# Patient Record
Sex: Female | Born: 1970 | Race: White | Hispanic: No | Marital: Married | Smoking: Current every day smoker
Health system: Southern US, Community
[De-identification: ages and names within clinical notes are randomized; demographics above are authoritative.]

## PROBLEM LIST (undated history)

## (undated) DIAGNOSIS — N926 Irregular menstruation, unspecified: Secondary | ICD-10-CM

## (undated) DIAGNOSIS — R7989 Other specified abnormal findings of blood chemistry: Secondary | ICD-10-CM

## (undated) DIAGNOSIS — T8859XA Other complications of anesthesia, initial encounter: Secondary | ICD-10-CM

## (undated) DIAGNOSIS — R519 Headache, unspecified: Secondary | ICD-10-CM

## (undated) DIAGNOSIS — Z8669 Personal history of other diseases of the nervous system and sense organs: Secondary | ICD-10-CM

## (undated) DIAGNOSIS — R945 Abnormal results of liver function studies: Secondary | ICD-10-CM

## (undated) DIAGNOSIS — Z87442 Personal history of urinary calculi: Secondary | ICD-10-CM

## (undated) DIAGNOSIS — Z8489 Family history of other specified conditions: Secondary | ICD-10-CM

## (undated) DIAGNOSIS — F419 Anxiety disorder, unspecified: Secondary | ICD-10-CM

## (undated) DIAGNOSIS — T4145XA Adverse effect of unspecified anesthetic, initial encounter: Secondary | ICD-10-CM

## (undated) DIAGNOSIS — E348 Other specified endocrine disorders: Secondary | ICD-10-CM

## (undated) DIAGNOSIS — E785 Hyperlipidemia, unspecified: Secondary | ICD-10-CM

## (undated) DIAGNOSIS — R51 Headache: Secondary | ICD-10-CM

## (undated) DIAGNOSIS — E079 Disorder of thyroid, unspecified: Secondary | ICD-10-CM

## (undated) HISTORY — PX: WISDOM TOOTH EXTRACTION: SHX21

## (undated) HISTORY — DX: Other specified endocrine disorders: E34.8

## (undated) HISTORY — PX: TUBAL LIGATION: SHX77

## (undated) HISTORY — DX: Hyperlipidemia, unspecified: E78.5

## (undated) HISTORY — DX: Personal history of urinary calculi: Z87.442

## (undated) HISTORY — DX: Irregular menstruation, unspecified: N92.6

## (undated) HISTORY — DX: Personal history of other diseases of the nervous system and sense organs: Z86.69

## (undated) HISTORY — DX: Abnormal results of liver function studies: R94.5

## (undated) HISTORY — DX: Other specified abnormal findings of blood chemistry: R79.89

## (undated) HISTORY — DX: Disorder of thyroid, unspecified: E07.9

---

## 2000-07-31 ENCOUNTER — Encounter: Admission: RE | Admit: 2000-07-31 | Discharge: 2000-07-31 | Payer: Self-pay | Admitting: Family Medicine

## 2000-07-31 ENCOUNTER — Encounter: Payer: Self-pay | Admitting: Family Medicine

## 2001-10-23 ENCOUNTER — Encounter: Payer: Self-pay | Admitting: Family Medicine

## 2001-10-23 ENCOUNTER — Encounter: Admission: RE | Admit: 2001-10-23 | Discharge: 2001-10-23 | Payer: Self-pay | Admitting: Family Medicine

## 2004-12-20 ENCOUNTER — Encounter: Admission: RE | Admit: 2004-12-20 | Discharge: 2004-12-20 | Payer: Self-pay | Admitting: Family Medicine

## 2004-12-26 ENCOUNTER — Inpatient Hospital Stay (HOSPITAL_COMMUNITY): Admission: EM | Admit: 2004-12-26 | Discharge: 2004-12-30 | Payer: Self-pay | Admitting: Emergency Medicine

## 2005-11-02 ENCOUNTER — Encounter: Admission: RE | Admit: 2005-11-02 | Discharge: 2005-11-02 | Payer: Self-pay | Admitting: Family Medicine

## 2006-12-18 ENCOUNTER — Encounter: Admission: RE | Admit: 2006-12-18 | Discharge: 2006-12-18 | Payer: Self-pay | Admitting: Family Medicine

## 2006-12-18 LAB — HM MAMMOGRAPHY

## 2007-05-10 HISTORY — PX: KIDNEY STONE SURGERY: SHX686

## 2007-07-25 LAB — HM PAP SMEAR

## 2010-09-24 NOTE — Discharge Summary (Signed)
[name], [name]              ACCOUNT NO.:  [id number]   MEDICAL RECORD NO.:  [id number]          PATIENT TYPE:  INP   LOCATION:  A312                          FACILITY:  [facility]   PHYSICIAN:  Dennie Maizes, M.D.   DATE OF BIRTH:  28-Dec-1970   DATE OF ADMISSION:  12/26/2004  DATE OF DISCHARGE:  08/24/2006LH                                 DISCHARGE SUMMARY   FINAL DIAGNOSES:  1.  Left distal uretal calculus with obstruction.  2.  Left hydronephrosis.  3.  Left renal colic.   PROCEDURES ON December 29, 2004:  1.  Cystoscopy.  2.  Left retrograde pyelogram.  3.  Left ureteroscopy.  4.  Stone manipulation.  5.  Left aortal stent placement.   COMPLICATIONS:  None.   SUMMARY:  This 40 year old female was experiencing left flank pain radiating  to the front associated with nausea.  She went to the [facility] in  [place].  Noncontrast CT scan of the abdomen and pelvis revealed a 3x2 mm  size left mid ureteral calculus of the level of the L4 transverse process.  The patient was sent home with Percocet, as well as, Cipro.  The patient had  recurrent, severe pain on December 25, 2004.  She came to the emergency room  at [facility].  CT scan was repeated.  The stone migrated to the  left distal uretal about 2 cm above the uretal vesicle junction.  There was  increasing hydronephrosis, as well as, perinephric stranding.  The patient's  pain as quickly controlled in the emergency room.  She was admitted to the  hospital for IV fluids, pain control and further treatment.  There is no  past history of urolithiasis.  She denied having fever, chills, voiding  difficulty or gross hematuria.   PAST MEDICAL HISTORY:  No medical illness.   PAST SURGICAL HISTORY:  Status post tubal ligation.   MEDICATIONS:  1.  Cipro.  2.  Tylox.   ALLERGIES:  None.   PHYSICAL EXAMINATION:  GENERAL APPEARANCE:  The patient was comfortable  after receiving parenteral narcotics.  HEENT:   Normal.  NECK:  No masses.  LUNGS:  Clear to auscultation.  HEART:  Regular rate and rhythm, no murmurs.  ABDOMEN:  Soft, nonpalpable.  No flank mass.  Moderate costovertebral  tenderness was noted.  Bladder was not palpable.  No suprapubic tenderness.   ADMISSION LABORATORY DATA:  CBC:  WBC 14.9, hemoglobin 14.6, hematocrit  42.4, BUN 7, creatinine 0.9.  Electrolytes within normal range.  Urine  pregnancy test was negative.  Urinalysis revealed large blood nitrate,  negative leukocyte esterase.  Negative WBC 3-6 per high-powered field, RBC  21-55 per high-powered field.   HOSPITAL COURSE:  After admission to the hospital, the patient received IV  fluids and narcotics.  An x-ray of the KUB area was done.  This revealed a  3x2 mm sized left distal uretal calculus.  The patient continued to have  severe pain.  I discussed with the patient regarding management options.  She elected to undergo ureteroscopy stone extraction.   She was  taken to the OR on December 29, 2004.  Cystoscopy and left retrograde  pyelogram were done.  This revealed a filling defect in the distal ureter.  During ureteroscopy, the stone migrated to the upper ureter and could not be  retrieved.  A left uretal stent was placed.  The patient had good pain  relief after the stone manipulation and stent placement.  She was afebrile  with relief of pain on December 30, 2004.  She was discharged and sent home  Sep 29, 2004.  She was given Cipro and Tylox.  She will be reviewed in the  office on December 31, 2004, at which time the left aortal stent at the string  will be removed.  She was asked to call me for fever or severe flank pain.   CONDITION ON DISCHARGE:  Stable.      Dennie Maizes, M.D.  Electronically Signed     SK/MEDQ  D:  01/31/2005  T:  02/01/2005  Job:  [id number]

## 2010-09-24 NOTE — H&P (Signed)
[name], [name]              ACCOUNT NO.:  [id number]   MEDICAL RECORD NO.:  [id number]          PATIENT TYPE:  INP   LOCATION:  A312                          FACILITY:  [facility]   PHYSICIAN:  Dennie Maizes, M.D.   DATE OF BIRTH:  26-Oct-1970   DATE OF ADMISSION:  12/26/2004  DATE OF DISCHARGE:  LH                                HISTORY & PHYSICAL   CHIEF COMPLAINT:  Severe left flank pain radiating to the front, nausea.   HISTORY OF PRESENT ILLNESS:  This 40 year old female experiencing left flank  pain radiating to the front, associated with nausea.  She went to the [facility] in [place].  Noncontrast CT scan of abdomen and pelvis  revealed a 3 x 2 mm size left mid ureteral calculus at the level of L4  transverse process.  The patient was sent home with Cipro as well as  Percocet.  The patient had recurrent severe pain last night, and she came to  the emergency room at [facility].  A CT scan was repeated.  It  started to migrate to the left distal ureter about 2 cm above the  uterosacral junction.  There was increase in the hydronephrosis as well as  perinephric stranding.  The patient's pain was not adequately controlled in  the emergency room.  She has been admitted to the hospital for pain control,  IV fluids, and further treatment.  The patient does not have a history of  urolithiasis in the past.  She did not have any fever, chills, voiding  difficulty, or gross hematuria.   PAST MEDICAL HISTORY:  No medical illnesses.  Status post tubal ligation.   MEDICATIONS:  Cipro and Tylox.   ALLERGIES:  None.   PHYSICAL EXAMINATION:  GENERAL:  The patient is comfortable after receiving  parenteral narcotics.  HEAD, EYES, EARS, NOSE, AND THROAT:  Normal.  NECK:  No masses.  LUNGS:  Clear to auscultation.  HEART: Regular rate and rhythm.  No murmurs.  ABDOMEN:  Soft, no palpable flank mass.  Moderate left costovertebral angle  tenderness was noted.  Bladder  not palpable.  No suprapubic tenderness.   ADMISSION LABORATORY DATA:  CBC: WBC 14.9, hemoglobin 14.6, hematocrit 42.4.  BUN 7, creatinine 0.9.  Electrolytes within normal range.  Urine pregnancy  test is negative.  Urinalysis revealed large blood, nitrite negative,  leukocyte esterase negative, wbc's 3 to 6 per high power field, rbc's 21 to  50 per high power field.   IMPRESSION:  1.  Left distal ureter calculus with obstruction.  2.  Left renal colic.  3.  Left hydronephrosis.   PLAN:  1.  Admit the patient to the hospital.  2.  IV fluids.  3.  Strain all urine  4.  Parenteral narcotics.  5.  Discussed with the patient regarding management options.  Will repeat      KUB for stone migration.  If she has persistent severe pain, will      proceed with cystoscopy, left retrograde pyelogram, and ureteroscopy      stone extraction.  Dennie Maizes, M.D.  Electronically Signed     SK/MEDQ  D:  12/27/2004  T:  12/27/2004  Job:  [id number]

## 2010-09-24 NOTE — Op Note (Signed)
[name], [name]              ACCOUNT NO.:  [id number]   MEDICAL RECORD NO.:  [id number]          PATIENT TYPE:  INP   LOCATION:  A312                          FACILITY:  [facility]   PHYSICIAN:  Dennie Maizes, M.D.   DATE OF BIRTH:  Aug 24, 1970   DATE OF PROCEDURE:  12/29/2004  DATE OF DISCHARGE:                                 OPERATIVE REPORT   PREOPERATIVE DIAGNOSIS:  Left distal ureteral calculus with obstruction,  left renal colic, left hydronephrosis.   POSTOPERATIVE DIAGNOSIS:  Left distal ureteral calculus with obstruction,  left renal colic, left hydronephrosis.   OPERATIVE PROCEDURE:  Cystoscopy, left retrograde pyelogram,  left  ureteroscopy stone manipulation and left ureteral stent placement.   ANESTHESIA:  General.   SURGEON:  Dennie Maizes, M.D.   BLOOD LOSS:  None.   ESTIMATED BLOOD LOSS:  None.   DRAINS:  6 French 26 cm size left ureteral stent with a string.   INDICATIONS FOR PROCEDURE:  This 40 year old female was admitted to the  hospital with severe left flank pain. Evaluation revealed a 3 x 2 mm size  left distal ureteral calculus without obstruction and hydronephrosis.  The  patient was unable to pass the stone.  Preoperative KUB revealed a 3 x 2 mm  size left distal ureteral calculus. The patient was taken to the OR today  for cystoscopy, left retrograde pyelogram, ureteroscopy stone extraction and  stent placement.   DESCRIPTION OF PROCEDURE:  General anesthesia was induced and the patient  was placed on the OR table in the -dorsal lithotomy position. The lower  abdomen and genitalia were prepped and draped in a sterile fashion.  Cystoscopy was done with a 25-French scope. The appearance of bladder was  normal.  The trigone, ureteral orifices and bladder mucosa were  unremarkable.  The 5-French catheter was then placed in the left ureteral  orifice and retrograde pyelogram was done using about 7 cc of Renografin 60.  There was a small filling  defect in the distal ureter with change in caliber  of the ureter size.  From proximal to the filling defect, there was  hydroureter and hydronephrosis.  .   The 5-French open-ended catheter was then placed in the left ureteral  orifice.  A 0.038'' Benson guidewire was then inserted to the left  collecting system without any difficulty.  The distal ureter was then  dilated using a 15-French balloon dilating catheter.  The balloon dilating  catheter was then removed leaving the guidewire in place.   Ureteroscopy was done with 9-French rigid ureteroscope.  There was erythema  of the distal ureter on the site of impaction of the stone.  The stone could  not be seen.  I presume the stone had migrated to the upper collecting  system during the placement of the guidewire.  I looked into the proximal  ureter and there were no stones seen.  The ureteroscope was then removed.  The 6-French 36 cm size stent with a string was then inserted the left  collecting system.  The patient was transferred to the PACU in a  satisfactory condition.  I anticipate a small stone to pass after removal of the stent.      Dennie Maizes, M.D.  Electronically Signed     SK/MEDQ  D:  12/29/2004  T:  12/30/2004  Job:  [id number]

## 2013-08-09 ENCOUNTER — Encounter: Payer: Self-pay | Admitting: Family Medicine

## 2013-08-09 DIAGNOSIS — R7989 Other specified abnormal findings of blood chemistry: Secondary | ICD-10-CM | POA: Insufficient documentation

## 2013-08-09 DIAGNOSIS — E348 Other specified endocrine disorders: Secondary | ICD-10-CM | POA: Insufficient documentation

## 2013-08-09 DIAGNOSIS — Z87442 Personal history of urinary calculi: Secondary | ICD-10-CM | POA: Insufficient documentation

## 2013-08-09 DIAGNOSIS — E079 Disorder of thyroid, unspecified: Secondary | ICD-10-CM | POA: Insufficient documentation

## 2013-08-09 DIAGNOSIS — E785 Hyperlipidemia, unspecified: Secondary | ICD-10-CM | POA: Insufficient documentation

## 2013-08-09 DIAGNOSIS — N926 Irregular menstruation, unspecified: Secondary | ICD-10-CM | POA: Insufficient documentation

## 2013-08-09 DIAGNOSIS — Z8669 Personal history of other diseases of the nervous system and sense organs: Secondary | ICD-10-CM | POA: Insufficient documentation

## 2013-08-09 DIAGNOSIS — R945 Abnormal results of liver function studies: Secondary | ICD-10-CM

## 2013-08-14 ENCOUNTER — Encounter: Payer: Self-pay | Admitting: Physician Assistant

## 2013-08-14 ENCOUNTER — Ambulatory Visit (INDEPENDENT_AMBULATORY_CARE_PROVIDER_SITE_OTHER): Payer: BC Managed Care – PPO | Admitting: Physician Assistant

## 2013-08-14 VITALS — BP 96/64 | HR 96 | Temp 97.4°F | Resp 18 | Ht 63.25 in | Wt 146.0 lb

## 2013-08-14 DIAGNOSIS — E785 Hyperlipidemia, unspecified: Secondary | ICD-10-CM

## 2013-08-14 DIAGNOSIS — E079 Disorder of thyroid, unspecified: Secondary | ICD-10-CM

## 2013-08-14 DIAGNOSIS — E038 Other specified hypothyroidism: Secondary | ICD-10-CM | POA: Insufficient documentation

## 2013-08-14 DIAGNOSIS — Z23 Encounter for immunization: Secondary | ICD-10-CM

## 2013-08-14 DIAGNOSIS — E559 Vitamin D deficiency, unspecified: Secondary | ICD-10-CM | POA: Insufficient documentation

## 2013-08-14 DIAGNOSIS — N952 Postmenopausal atrophic vaginitis: Secondary | ICD-10-CM

## 2013-08-14 DIAGNOSIS — Z0000 Encounter for general adult medical examination without abnormal findings: Secondary | ICD-10-CM

## 2013-08-14 DIAGNOSIS — E039 Hypothyroidism, unspecified: Secondary | ICD-10-CM

## 2013-08-14 DIAGNOSIS — F17200 Nicotine dependence, unspecified, uncomplicated: Secondary | ICD-10-CM

## 2013-08-14 LAB — CBC WITH DIFFERENTIAL/PLATELET
Basophils Absolute: 0 10*3/uL (ref 0.0–0.1)
Basophils Relative: 0 % (ref 0–1)
Eosinophils Absolute: 0.2 10*3/uL (ref 0.0–0.7)
Eosinophils Relative: 2 % (ref 0–5)
HCT: 41.7 % (ref 36.0–46.0)
HEMOGLOBIN: 14.1 g/dL (ref 12.0–15.0)
LYMPHS PCT: 31 % (ref 12–46)
Lymphs Abs: 2.8 10*3/uL (ref 0.7–4.0)
MCH: 29.7 pg (ref 26.0–34.0)
MCHC: 33.8 g/dL (ref 30.0–36.0)
MCV: 88 fL (ref 78.0–100.0)
MONOS PCT: 8 % (ref 3–12)
Monocytes Absolute: 0.7 10*3/uL (ref 0.1–1.0)
NEUTROS ABS: 5.4 10*3/uL (ref 1.7–7.7)
Neutrophils Relative %: 59 % (ref 43–77)
PLATELETS: 319 10*3/uL (ref 150–400)
RBC: 4.74 MIL/uL (ref 3.87–5.11)
RDW: 13.6 % (ref 11.5–15.5)
WBC: 9.1 10*3/uL (ref 4.0–10.5)

## 2013-08-14 LAB — COMPLETE METABOLIC PANEL WITH GFR
ALBUMIN: 4.2 g/dL (ref 3.5–5.2)
ALK PHOS: 97 U/L (ref 39–117)
ALT: 15 U/L (ref 0–35)
AST: 17 U/L (ref 0–37)
BILIRUBIN TOTAL: 0.3 mg/dL (ref 0.2–1.2)
BUN: 9 mg/dL (ref 6–23)
CALCIUM: 9.5 mg/dL (ref 8.4–10.5)
CHLORIDE: 104 meq/L (ref 96–112)
CO2: 25 mEq/L (ref 19–32)
CREATININE: 0.64 mg/dL (ref 0.50–1.10)
GLUCOSE: 78 mg/dL (ref 70–99)
Potassium: 4.1 mEq/L (ref 3.5–5.3)
SODIUM: 140 meq/L (ref 135–145)
TOTAL PROTEIN: 6.5 g/dL (ref 6.0–8.3)

## 2013-08-14 LAB — LIPID PANEL
CHOLESTEROL: 183 mg/dL (ref 0–200)
HDL: 38 mg/dL — AB (ref >39)
LDL CALC: 116 mg/dL — AB (ref 0–99)
TRIGLYCERIDES: 145 mg/dL (ref <150)
Total CHOL/HDL Ratio: 4.8 Ratio
VLDL: 29 mg/dL (ref 0–40)

## 2013-08-14 LAB — T4, FREE: FREE T4: 1.11 ng/dL (ref 0.80–1.80)

## 2013-08-14 LAB — TSH: TSH: 3.5 u[IU]/mL (ref 0.350–4.500)

## 2013-08-14 MED ORDER — BUPROPION HCL ER (SR) 150 MG PO TB12
150.0000 mg | ORAL_TABLET | Freq: Two times a day (BID) | ORAL | Status: DC
Start: 2013-08-14 — End: 2014-03-06

## 2013-08-14 MED ORDER — ESTROGENS, CONJUGATED 0.625 MG/GM VA CREA: 1.0000 | TOPICAL_CREAM | Freq: Every day | VAGINAL | Status: DC

## 2013-08-14 NOTE — Progress Notes (Signed)
Patient ID: Debra LoboRebecca J Adams MRN: [id number], DOB: [birth year], 43 y.o. Date of Encounter: 08/14/2013,   Chief Complaint: Physical (CPE)  HPI: 43 y.o. y/o female  here for CPE.   SHe does report that she has noticed a lot of vaginal dryness and dyspareunia since being postmenopausal.  No other complaints.   Review of Systems: Consitutional: No fever, chills, fatigue, night sweats, lymphadenopathy. No significant/unexplained weight changes. Eyes: No visual changes, eye redness, or discharge. ENT/Mouth: No ear pain, sore throat, nasal drainage, or sinus pain. Cardiovascular: No chest pressure,heaviness, tightness or squeezing, even with exertion. No increased shortness of breath or dyspnea on exertion.No palpitations, edema, orthopnea, PND. Respiratory: No cough, hemoptysis, SOB, or wheezing. Gastrointestinal: No anorexia, dysphagia, reflux, pain, nausea, vomiting, hematemesis, diarrhea, constipation, BRBPR, or melena. Breast: No mass, nodules, bulging, or retraction. No skin changes or inflammation. No nipple discharge. No lymphadenopathy. Genitourinary: No dysuria, hematuria, incontinence, vaginal discharge, pruritis, burning, abnormal bleeding, or pain. Musculoskeletal: No decreased ROM, No joint pain or swelling. No significant pain in neck, back, or extremities. Skin: No rash, pruritis, or concerning lesions. Neurological: No headache, dizziness, syncope, seizures, tremors, memory loss, coordination problems, or paresthesias. Psychological: No anxiety, depression, hallucinations, SI/HI. Endocrine: No polydipsia, polyphagia, polyuria, or known diabetes.No increased fatigue. No palpitations/rapid heart rate. No significant/unexplained weight change. All other systems were reviewed and are otherwise negative.  Past Medical History  Diagnosis Date  . Menses, irregular   . History of kidney stones   . Elevated LFTs   . Pineal gland cyst   . Hyperlipidemia     hx of  . Thyroid  disease     hx abnormal thyroid levels  . Hx of migraines      Past Surgical History  Procedure Laterality Date  . Tubal ligation    . Kidney stone surgery  2009    Home Meds: She is currently taking NO MEDICATIONS No current outpatient prescriptions on file prior to visit.   No current facility-administered medications on file prior to visit.    Allergies: No Known Allergies  History   Social History  . Marital Status: Married    Spouse Name: N/A    Number of Children: N/A  . Years of Education: N/A   Occupational History  . Not on file.   Social History Main Topics  . Smoking status: Current Every Day Smoker -- 0.75 packs/day    Types: Cigarettes  . Smokeless tobacco: Never Used  . Alcohol Use: No  . Drug Use: No  . Sexual Activity: Not on file   Other Topics Concern  . Not on file   Social History Narrative   Married. 2 sons. 2 grandsons.   No exercise.   Walks a lot at work. ( 5-6 miles!!)    Family History  Problem Relation Age of Onset  . Cancer Mother 768    breast cancer  . Diabetes Mother [id number]  . Hypertension Father     Physical Exam: Blood pressure 96/64, pulse 96, temperature 97.4 F (36.3 C), temperature source Oral, resp. rate 18, height 5' 3.25" (1.607 m), weight 146 lb (66.225 kg)., Body mass index is 25.64 kg/(m^2). General: Well developed, well nourished,WF. appears in no acute distress. HEENT: Normocephalic, atraumatic. Conjunctiva pink, sclera non-icteric. Pupils 2 mm constricting to 1 mm, round, regular, and equally reactive to light and accomodation. EOMI. Internal auditory canal clear. TMs with good cone of light and without pathology. Nasal mucosa pink. Nares are without discharge. No sinus  tenderness. Oral mucosa pink.  Pharynx without exudate.   Neck: Supple. Trachea midline. No thyromegaly. Full ROM. No lymphadenopathy.No Carotid Bruits. Lungs: Clear to auscultation bilaterally without wheezes, rales, or rhonchi. Breathing is of  normal effort and unlabored. Cardiovascular: RRR with S1 S2. No murmurs, rubs, or gallops. Distal pulses 2+ symmetrically. No carotid or abdominal bruits. Breast: Symmetrical. No masses. Nipples without discharge. Abdomen: Soft, non-tender, non-distended with normoactive bowel sounds. No hepatosplenomegaly or masses. No rebound/guarding. No CVA tenderness. No hernias.  Genitourinary:  External genitalia without lesions. Vaginal mucosa pink.No discharge present. Cervix pink and without discharge. No cervical tenderness.Normal uterus size. No adnexal mass or tenderness.  Pap smear taken. Musculoskeletal: Full range of motion and 5/5 strength throughout. Without swelling, atrophy, tenderness, crepitus, or warmth. Extremities without clubbing, cyanosis, or edema. Calves supple. Skin: Warm and moist without erythema, ecchymosis, wounds, or rash. Neuro: A+Ox3. CN II-XII grossly intact. Moves all extremities spontaneously. Full sensation throughout. Normal gait. DTR 2+ throughout upper and lower extremities. Finger to nose intact. Psych:  Responds to questions appropriately with a normal affect.   Assessment/Plan:  43 y.o. y/o female here for CPE 1. Visit for preventive health examination   A. Screening Labs: Check labs now. See those listed below. He did drink about 6 ounces of [facility]. Otherwise fasting. Cannot easily return fasting so we'll go ahead and check labs now.  B. Pap: Due for repeat Pap smear. See order below.  C. Screening Mammogram: Patient states that she has had 2 mammograms so far. Says that she just received a letter that mammogram is due and she will call to schedule this.  D. DEXA/BMD:  She is only age 58. Weight for this.  E. Colorectal Cancer Screening: We'll start this at age 14. No indications to start this sooner.  F. Immunizations:  Influenza: N/A Tetanus: Last tetanus greater than 10 years ago. pt agreeable to update this today. Pneumococcal: Given that she is a  smoker she does need Pneumovax 23. Discussed this with her and she is agreeable to get this today. Zostavax: Weight 2 discuss at age 32.   - CBC with Differential - COMPLETE METABOLIC PANEL WITH GFR - Lipid panel - TSH - Vit D  25 hydroxy (rtn osteoporosis monitoring) - T4, free - PAP, Thin Prep w/HPV rflx HPV Type 16/18  2. Smoker She reports that she is getting way to start a new job. Last date of discharge May 1. New job starts June 1. When on vacation during that month of May. Her new job she would not be able to smoke. Is interested in quitting. Says in the past she used Wellbutrin and did stop smoking while on the medication. Says that when she went off of the medication the craving came back and she started back smoking. Recommend that we go back on Wellbutrin and she is agreeable with this approach. This time our plan to go ahead and keep her on this for 6 months. Discussed that she can stay on this longer if needed. Wrote out on her AVS that for the first 5 days she is to take one tablet daily and then after that can go up to taking one twice daily. - buPROPion (WELLBUTRIN SR) 150 MG 12 hr tablet; Take 1 tablet (150 mg total) by mouth 2 (two) times daily.  Dispense: 60 tablet; Refill: 5  3. Hyperlipidemia She has a history of hyperlipidemia here and we had to start Zocor. I asked her about this today and  she has no recollection of this does not remember ever being on Zocor. - COMPLETE METABOLIC PANEL WITH GFR - Lipid panel  4. Thyroid disease At prior labs here she had subclinical hypothyroidism and we plan to recheck labs 6 months. She has no recollection. - TSH - T4, free  5. Subclinical hypothyroidism See #4 above  6. Unspecified vitamin D deficiency History of very low vitamin D level. Patient has no recollection of this and is not on any vitamin D supplement. - Vit D  25 hydroxy (rtn osteoporosis monitoring)  7. Perimenopausal atrophic vaginitis Plan she can apply  this daily for one week and then decrease to 3 times a week. - conjugated estrogens (PREMARIN) vaginal cream; Place 1 Applicatorful vaginally daily.  Dispense: 42.5 g; Refill: 12  I expect for some of her labs to be abnormal and if so we'll plan followup accordingly.  [address] [place], [place], [facility] 08/14/2013 9:47 AM

## 2013-08-15 LAB — PAP, THIN PREP W/HPV RFLX HPV TYPE 16/18: HPV DNA HIGH RISK: NOT DETECTED

## 2013-08-15 LAB — VITAMIN D 25 HYDROXY (VIT D DEFICIENCY, FRACTURES): Vit D, 25-Hydroxy: 13 ng/mL — ABNORMAL LOW (ref 30–89)

## 2013-08-16 ENCOUNTER — Telehealth: Payer: Self-pay | Admitting: Family Medicine

## 2013-08-16 DIAGNOSIS — E559 Vitamin D deficiency, unspecified: Secondary | ICD-10-CM

## 2013-08-16 NOTE — Telephone Encounter (Signed)
Pt aware of normal PAP and lab results.  Vitamin d was low and instructed to start taking Vitamin D 4000 IU daily.  Follow up in 6 months

## 2013-08-16 NOTE — Telephone Encounter (Signed)
Message copied by Donne AnonPLUMMER, KIM M on Fri Aug 16, 2013  2:13 PM ------      Message from: Allayne ButcherIXON, MARY      Created: Fri Aug 16, 2013  7:35 AM       Start OTC Vit D --the 4,000 IU QD dose--Take QD      All other labs normal.       Recheck Vit D level 6 months ------

## 2014-02-21 ENCOUNTER — Other Ambulatory Visit: Payer: 59

## 2014-02-21 DIAGNOSIS — E785 Hyperlipidemia, unspecified: Secondary | ICD-10-CM

## 2014-02-21 DIAGNOSIS — E559 Vitamin D deficiency, unspecified: Secondary | ICD-10-CM

## 2014-02-21 DIAGNOSIS — E039 Hypothyroidism, unspecified: Secondary | ICD-10-CM

## 2014-02-21 DIAGNOSIS — I10 Essential (primary) hypertension: Secondary | ICD-10-CM

## 2014-02-21 DIAGNOSIS — Z79899 Other long term (current) drug therapy: Secondary | ICD-10-CM

## 2014-03-03 ENCOUNTER — Other Ambulatory Visit: Payer: 59

## 2014-03-03 LAB — COMPLETE METABOLIC PANEL WITH GFR
ALBUMIN: 4.3 g/dL (ref 3.5–5.2)
ALT: 15 U/L (ref 0–35)
AST: 18 U/L (ref 0–37)
Alkaline Phosphatase: 92 U/L (ref 39–117)
BUN: 11 mg/dL (ref 6–23)
CALCIUM: 9.3 mg/dL (ref 8.4–10.5)
CHLORIDE: 107 meq/L (ref 96–112)
CO2: 24 mEq/L (ref 19–32)
Creat: 0.81 mg/dL (ref 0.50–1.10)
GFR, EST NON AFRICAN AMERICAN: 89 mL/min
GLUCOSE: 92 mg/dL (ref 70–99)
Potassium: 4.3 mEq/L (ref 3.5–5.3)
Sodium: 139 mEq/L (ref 135–145)
TOTAL PROTEIN: 6.8 g/dL (ref 6.0–8.3)
Total Bilirubin: 0.3 mg/dL (ref 0.2–1.2)

## 2014-03-03 LAB — LIPID PANEL
Cholesterol: 182 mg/dL (ref 0–200)
HDL: 40 mg/dL (ref >39)
LDL CALC: 118 mg/dL — AB (ref 0–99)
Total CHOL/HDL Ratio: 4.6 Ratio
Triglycerides: 122 mg/dL (ref <150)
VLDL: 24 mg/dL (ref 0–40)

## 2014-03-03 LAB — TSH: TSH: 2.306 u[IU]/mL (ref 0.350–4.500)

## 2014-03-04 LAB — VITAMIN D 25 HYDROXY (VIT D DEFICIENCY, FRACTURES): VIT D 25 HYDROXY: 36 ng/mL (ref 30–89)

## 2014-03-06 ENCOUNTER — Ambulatory Visit (INDEPENDENT_AMBULATORY_CARE_PROVIDER_SITE_OTHER): Payer: 59 | Admitting: Physician Assistant

## 2014-03-06 ENCOUNTER — Encounter: Payer: Self-pay | Admitting: Physician Assistant

## 2014-03-06 VITALS — BP 104/66 | HR 88 | Temp 97.9°F | Resp 18 | Ht 63.5 in | Wt 153.0 lb

## 2014-03-06 DIAGNOSIS — Z0000 Encounter for general adult medical examination without abnormal findings: Secondary | ICD-10-CM

## 2014-03-06 DIAGNOSIS — F17200 Nicotine dependence, unspecified, uncomplicated: Secondary | ICD-10-CM

## 2014-03-06 DIAGNOSIS — Z720 Tobacco use: Secondary | ICD-10-CM

## 2014-03-06 DIAGNOSIS — E559 Vitamin D deficiency, unspecified: Secondary | ICD-10-CM

## 2014-03-06 MED ORDER — VITAMIN D 50 MCG (2000 UT) PO CAPS: 1.0000 | ORAL_CAPSULE | Freq: Every day | ORAL | Status: DC

## 2014-03-06 NOTE — Progress Notes (Signed)
Patient ID: [name] MRN: [id number], DOB: 11/16/1970, 43 y.o. Date of Encounter: 03/06/2014,   Chief Complaint: Physical (CPE)  HPI: 43 y.o. y/o female  here for CPE.   She saw me for complete physical exam 08/2013. However, she reports that she has gotten a new insurance and they have told her to come in and have another complete physical exam now. She has no complaints today.  Today I reviewed her medication list and the fact that we had 2 medications listed -- and that she was not currently taking those 2 medications. 1- Wellbutrin--she says that I had prescribed this last visit for smoking cessation. She says that the insurance would not pay for it and was can be very expensive so she never took it. Says that her new insurance will pay full coverage so that she can get patches for free. Says that she has not started the patches yet but they are in the process of shipping them to her. 2- Estrogen Vaginal Cream: At her visit 08/2013 she reported that she had noticed vaginal dryness and dyspareunia since being postmenopausal. At that visit I prescribed Premarin vaginal cream. She says that it was going to be very expensive so she did have restarted it. Says that she only notices problems with this when she is sexually active. Therefore she is just using some lubricant at those times. States that it other times she does not feel any symptoms related to this. Does not feel dryness irritation etc.  Says that she currently is smoking about 3/4 ppd.  Review of Systems: Consitutional: No fever, chills, fatigue, night sweats, lymphadenopathy. No significant/unexplained weight changes. Eyes: No visual changes, eye redness, or discharge. ENT/Mouth: No ear pain, sore throat, nasal drainage, or sinus pain. Cardiovascular: No chest pressure,heaviness, tightness or squeezing, even with exertion. No increased shortness of breath or dyspnea on exertion.No palpitations, edema, orthopnea,  PND. Respiratory: No cough, hemoptysis, SOB, or wheezing. Gastrointestinal: No anorexia, dysphagia, reflux, pain, nausea, vomiting, hematemesis, diarrhea, constipation, BRBPR, or melena. Breast: No mass, nodules, bulging, or retraction. No skin changes or inflammation. No nipple discharge. No lymphadenopathy. Genitourinary: No dysuria, hematuria, incontinence, vaginal discharge, pruritis, burning, abnormal bleeding, or pain. Musculoskeletal: No decreased ROM, No joint pain or swelling. No significant pain in neck, back, or extremities. Skin: No rash, pruritis, or concerning lesions. Neurological: No headache, dizziness, syncope, seizures, tremors, memory loss, coordination problems, or paresthesias. Psychological: No anxiety, depression, hallucinations, SI/HI. Endocrine: No polydipsia, polyphagia, polyuria, or known diabetes.No increased fatigue. No palpitations/rapid heart rate. No significant/unexplained weight change. All other systems were reviewed and are otherwise negative.  Past Medical History  Diagnosis Date  . Menses, irregular   . History of kidney stones   . Elevated LFTs   . Pineal gland cyst   . Hyperlipidemia     hx of  . Thyroid disease     hx abnormal thyroid levels  . Hx of migraines      Past Surgical History  Procedure Laterality Date  . Tubal ligation    . Kidney stone surgery  2009    Home Meds: Outpatient Prescriptions Prior to Visit  Medication Sig Dispense Refill  . Cholecalciferol (HM VITAMIN D3) 4000 UNITS CAPS Take 1 capsule by mouth daily.      .       .       No facility-administered medications prior to visit.     Allergies: No Known Allergies  History   Social History  .  Marital Status: Married    Spouse Name: N/A    Number of Children: N/A  . Years of Education: N/A   Occupational History  . Not on file.   Social History Main Topics  . Smoking status: Current Every Day Smoker -- 0.75 packs/day for 27 years    Types: Cigarettes   . Smokeless tobacco: Never Used  . Alcohol Use: No  . Drug Use: No  . Sexual Activity: Not on file   Other Topics Concern  . Not on file   Social History Narrative   Married.   2 sons. 2 grandsons.   Sees grandchildren frequently.   No exercise.   Walks a lot at work. ( 5-6 miles!!)    Family History  Problem Relation Age of Onset  . Cancer Mother 7668    breast cancer  . Diabetes Mother 6970  . Hypertension Father     Physical Exam: Blood pressure 104/66, pulse 88, temperature 97.9 F (36.6 C), temperature source Oral, resp. rate 18, height 5' 3.5" (1.613 m), weight 153 lb (69.4 kg)., Body mass index is 26.67 kg/(m^2). General: Well developed, well nourished,WF. appears in no acute distress. HEENT: Normocephalic, atraumatic. Conjunctiva pink, sclera non-icteric. Pupils 2 mm constricting to 1 mm, round, regular, and equally reactive to light and accomodation. EOMI. Internal auditory canal clear. TMs with good cone of light and without pathology. Nasal mucosa pink. Nares are without discharge. No sinus tenderness. Oral mucosa pink.  Pharynx without exudate.   Neck: Supple. Trachea midline. No thyromegaly. Full ROM. No lymphadenopathy.No Carotid Bruits. Lungs: Clear to auscultation bilaterally without wheezes, rales, or rhonchi. Breathing is of normal effort and unlabored. Cardiovascular: RRR with S1 S2. No murmurs, rubs, or gallops. Distal pulses 2+ symmetrically. No carotid or abdominal bruits. Breast: Deferred. She had breast exam by me 08/2013 and had mammogram yesterday. Abdomen: Soft, non-tender, non-distended with normoactive bowel sounds. No hepatosplenomegaly or masses. No rebound/guarding. No CVA tenderness. No hernias.  Genitourinary: Deferred today. She had pelvic exam and pap smear 08/2013. Musculoskeletal: Full range of motion and 5/5 strength throughout.  Skin: Warm and moist without erythema, ecchymosis, wounds, or rash. Neuro: A+Ox3. CN II-XII grossly intact. Moves all  extremities spontaneously. Full sensation throughout. Normal gait.  Psych:  Responds to questions appropriately with a normal affect.   Assessment/Plan:  43 y.o. y/o female here for CPE 1. Visit for preventive health examination  A. Screening Labs: She recently came fasting and had labs performed. Lipid panel showed triglyceride 122     HDL 40     LDL 118 CMP T normal TSH normal Vitamin D level had increased from 13 at the last check 08/2013--- up to 36 currently  B. Pap:  Pap smear performed here 08/2013. Cytology was normal. HPV was Not Detected.  C. Screening Mammogram: She reports that she just had mammogram performed yesterday. Says it was at a mobile unit at her work. Says that she gave them our practice to send a copy of the report to.  D. DEXA/BMD:  She is only age 43. Wait for this.  E. Colorectal Cancer Screening: Will start this at age 43. No indications to start this sooner.  F. Immunizations:  Influenza: She received influenza vaccine at her work 02/21/2014 Tetanus: Tdap was given here 08/14/2013 Pneumococcal:  Pneumovax 23 was given here 08/14/2013 (secondary to smoking).  No further pneumonia vaccine indicated until age 43. Zostavax: Wait to discuss at age 43.  2. Smoker I have given her a  handout with tips for cessation in the past. In the past I prescribed Wellbutrin but it was not covered with her insurance at that time. Currently she is going to be starting the patches which are paid for with her new job/insurance. Told her to follow-up with me if she is not successful with decreasing her smoking using the patches.  6. Unspecified vitamin D deficiency Today I reviewed her prior and current vitamin D levels with her. Discussed that she can decrease the vitamin D supplement from 4000 units to 2000 units daily.  7. Perimenopausal atrophic vaginitis See history of present illness. She will continue to use lubricant as needed.  F/U Office Visit one year or sooner if  needed.  Signed, [address], [place], [facility] 03/06/2014 8:29 AM

## 2014-03-26 ENCOUNTER — Encounter: Payer: Self-pay | Admitting: Family Medicine

## 2015-03-10 LAB — HM MAMMOGRAPHY: HM Mammogram: ABNORMAL

## 2015-03-13 ENCOUNTER — Encounter: Payer: Self-pay | Admitting: Family Medicine

## 2015-03-25 ENCOUNTER — Encounter: Payer: Self-pay | Admitting: Physician Assistant

## 2015-04-01 ENCOUNTER — Encounter: Payer: Self-pay | Admitting: Physician Assistant

## 2015-04-08 ENCOUNTER — Other Ambulatory Visit: Payer: Self-pay | Admitting: Family Medicine

## 2015-04-08 DIAGNOSIS — R928 Other abnormal and inconclusive findings on diagnostic imaging of breast: Secondary | ICD-10-CM

## 2015-04-10 LAB — HM MAMMOGRAPHY

## 2015-04-16 ENCOUNTER — Encounter: Payer: Self-pay | Admitting: *Deleted

## 2015-12-18 ENCOUNTER — Other Ambulatory Visit: Payer: Self-pay | Admitting: Physical Medicine and Rehabilitation

## 2015-12-18 ENCOUNTER — Ambulatory Visit
Admission: RE | Admit: 2015-12-18 | Discharge: 2015-12-18 | Disposition: A | Discharge status: Home / Self Care | Payer: 59 | Source: Ambulatory Visit | Attending: Physical Medicine and Rehabilitation | Admitting: Physical Medicine and Rehabilitation

## 2015-12-18 DIAGNOSIS — Z139 Encounter for screening, unspecified: Secondary | ICD-10-CM

## 2015-12-22 ENCOUNTER — Encounter: Payer: Self-pay | Admitting: Family Medicine

## 2015-12-22 ENCOUNTER — Ambulatory Visit (INDEPENDENT_AMBULATORY_CARE_PROVIDER_SITE_OTHER): Payer: Self-pay | Admitting: Family Medicine

## 2015-12-22 VITALS — BP 100/64 | HR 78 | Temp 98.2°F | Resp 16 | Wt 152.0 lb

## 2015-12-22 DIAGNOSIS — M545 Low back pain: Secondary | ICD-10-CM

## 2015-12-22 MED ORDER — PREDNISONE 20 MG PO TABS: ORAL_TABLET | ORAL | 0 refills | Status: DC

## 2015-12-22 MED ORDER — CYCLOBENZAPRINE HCL 10 MG PO TABS: 10.0000 mg | ORAL_TABLET | Freq: Three times a day (TID) | ORAL | 0 refills | Status: DC | PRN

## 2015-12-22 NOTE — Progress Notes (Signed)
Subjective:    Patient ID: Randye Loboebecca J Sartin, female    DOB: July 10, 1970, 45 y.o.   MRN: [id number]  HPI  Patient was injured at work earlier this week. 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 At the time the pain was not severe. However the following morning when she woke up the pain was intense. She has palpable muscle spasms in the left lower back. She denies any sciatica. She denies any numbness or weakness in her legs. She denies any radicular pain. She denies any bowel or bladder incontinence. Pain is located in the left lumbar paraspinal muscle region at approximately the level of L4. It is extremely tender to touch in that area. Past Medical History:  Diagnosis Date  . Elevated LFTs   . History of kidney stones   . Hx of migraines   . Hyperlipidemia    hx of  . Menses, irregular   . Pineal gland cyst   . Thyroid disease    hx abnormal thyroid levels   Past Surgical History:  Procedure Laterality Date  . KIDNEY STONE SURGERY  2009  . TUBAL LIGATION     Current Outpatient Prescriptions on File Prior to Visit  Medication Sig Dispense Refill  . Calcium Carbonate-Vitamin D (CALCIUM 500 + D PO) Take 1 tablet by mouth daily.    . Cholecalciferol (VITAMIN D) 2000 UNITS CAPS Take 1 capsule (2,000 Units total) by mouth daily. 30 capsule 11   No current facility-administered medications on file prior to visit.    No Known Allergies Social History   Social History  . Marital status: Married    Spouse name: N/A  . Number of children: N/A  . Years of education: N/A   Occupational History  . Not on file.   Social History Main Topics  . Smoking status: Current Every Day Smoker    Packs/day: 0.75    Years: 27.00    Types: Cigarettes  . Smokeless tobacco: Never Used  . Alcohol use No  . Drug use: No  . Sexual activity: Not on file   Other Topics Concern  . Not on file   Social History Narrative   Married.     2 sons. 2 grandsons.   Sees grandchildren frequently.   No exercise.   Walks a lot at work. ( 5-6 miles!!)     Review of Systems  All other systems reviewed and are negative.      Objective:   Physical Exam  Constitutional: She is oriented to person, place, and time.  Cardiovascular: Normal rate, regular rhythm, normal heart sounds and intact distal pulses.   Pulmonary/Chest: Effort normal and breath sounds normal.  Musculoskeletal:       Lumbar back: She exhibits decreased range of motion, tenderness, pain and spasm. She exhibits no bony tenderness.  Neurological: She is alert and oriented to person, place, and time. She has normal reflexes. She displays normal reflexes. No cranial nerve deficit. She exhibits normal muscle tone. Coordination normal.  Vitals reviewed.         Assessment & Plan:  Low back pain without sciatica, unspecified back pain laterality - Plan: predniSONE (DELTASONE) 20 MG tablet, cyclobenzaprine (FLEXERIL) 10 MG tablet  Patient strained her lumbar paraspinal muscle. Begin Flexeril 10 mg every 8 hours as needed for muscle spasms. Begin prednisone taper pack. I see no indication for x-rays at the present time as I  believe this is muscular. Recommended rest. No heavy lifting for the next 2 weeks greater than 5 pounds. Patient is able to stand and sit at work alternating positions as she feels comfortable. She is not to perform any pushing or pulling activities for at least the next week. Recheck in 2 weeks

## 2015-12-25 ENCOUNTER — Telehealth: Payer: Self-pay | Admitting: Family Medicine

## 2015-12-25 NOTE — Telephone Encounter (Signed)
Pt called [employer] stating that she no longer needed FMLA as her work place was going to cover her expenses and that we needed to send them the bill and was questioning about sending over chart notes.   Called [employer] that I did receive her FMLA and will disregard that however I know nothing about [facility] and will have Print production plannerffice Manager give her a call back sometime next week.

## 2016-02-18 ENCOUNTER — Other Ambulatory Visit: Payer: Self-pay | Admitting: Occupational Medicine

## 2016-02-18 ENCOUNTER — Ambulatory Visit: Payer: Self-pay

## 2016-02-18 DIAGNOSIS — M545 Low back pain: Secondary | ICD-10-CM

## 2016-03-21 ENCOUNTER — Telehealth: Payer: Self-pay | Admitting: Family Medicine

## 2016-03-21 NOTE — Telephone Encounter (Signed)
Pt needs to have additional mammography studies.  Imaging has not been able to reach patient.  Letter sent to patient to follow up with further studies asap.

## 2016-03-21 NOTE — Telephone Encounter (Signed)
-----   Message from Donita BrooksWarren T Pickard, MD sent at 11/23/2015  7:36 AM EDT ----- Needs repeat mammogram scheduled.  I was sent a letter from [facility] saying she did not follow up.  MBD patient.

## 2016-05-16 ENCOUNTER — Telehealth: Payer: Self-pay | Admitting: Family Medicine

## 2016-05-16 NOTE — Telephone Encounter (Signed)
Provider rec'd letter back in July about pt following up with mammogram.  Have been trying to reach her.  Spoke to her today.  Said she was aware but has not called because insurance has not paid for ultrasound done last year.  Said she will gladly schedule her annual mammogram but nothing further if insurance is not paying.  I called [facility] and scheduled mammogram for 06/08/16 at 430 PM.  [facility] [address].  Pt made aware.  She can call [phone] with any questions or need to chg appt.

## 2016-06-08 ENCOUNTER — Encounter: Payer: Self-pay | Admitting: Physician Assistant

## 2016-06-08 DIAGNOSIS — Z1231 Encounter for screening mammogram for malignant neoplasm of breast: Secondary | ICD-10-CM | POA: Diagnosis not present

## 2016-07-14 ENCOUNTER — Telehealth: Payer: Self-pay | Admitting: Family Medicine

## 2016-07-14 NOTE — Telephone Encounter (Signed)
Provider received letter from [facility] in reference to 3D mammogram done in January.  Mammogram showed densities in right breast and they felt this needed additional studies.  They report they have been unable to reach patient and have contacted provider to see if this has been done elsewhere.  Pt had similar issues after mammogram in 2016.  Her problem then, she said, was that insurance would not pay for the additional studies.  I have placed a call to patient to find out what situation is with this mammogram.

## 2016-07-15 NOTE — Telephone Encounter (Signed)
Pt returned our call.  She has not scheduled any follow up studies due to insurance will not pay and still has not been able to pay from previous year.  Has been out of work due to work injury and has surgery pending to repair her back from injury.  Says she knows she has dense breast tissue.  Says she does self exams frequently and denies any pain, tenderness.  Does not feel any abnormalities.  No breast discharge.  Insurance covers her yearly mammograms and for now she will stick with that.

## 2016-07-27 ENCOUNTER — Ambulatory Visit: Payer: Self-pay | Admitting: Orthopedic Surgery

## 2016-07-27 ENCOUNTER — Encounter (HOSPITAL_COMMUNITY): Payer: Self-pay | Admitting: *Deleted

## 2016-07-27 NOTE — Progress Notes (Signed)
Surgery on 3/28.  preop on 08/01/16.  Need orders in epic. Thank You.

## 2016-07-27 NOTE — H&P (Signed)
[name] is an 46 y.o. female.   Chief Complaint: back and left leg pain HPI: The patient is a 46 year old female who presents today for follow up of their back. The patient is being followed for their back pain. They are now 7 month(s) out from when symptoms began (DOI 12/21/15). Symptoms reported today include: pain. Current treatment includes: activity modification, NSAIDs and pain medications. The following medication has been used for pain control: gabapentin and meloxicam. The patient presents today following a second opinion by Dr. Noel Gerold. Note for "Follow-up back": The patient was placed on a 10lb. lifting restriction, no repetitive bendign, and no prolonged sitting or standing. [name].  [name] follows up here with case manager, [name], and she was kindly seen by Dr. Sharolyn Douglas for a second opinion on 07/12/2016. She presents today complaining mainly of leg pain and intermittent minor back pain. She has reported that she has been reassigned at work and she has to go down the couple of ramps to get to the break room and that is a longer ambulation and aggravates her leg.  REVIEW OF SYSTEMS Review of systems is negative for fevers, chest pain, shortness of breath, unexplained recent weight loss, loss of bowel or bladder function, burning with urination, joint swelling, rashes, weakness or numbness, difficulty with balance, easy bruising, excessive thirst or frequent urination.  She reports that she has actually reduced her tobacco use, which she has persisted.  She did have an IME for which she saw Dr. Noel Gerold.  He reported that it is reasonable for decompression, he is concerned that she might have a relapse of that or require a fusion in the future and has suggested the possibility of a concomitant fusion.  Past Medical History:  Diagnosis Date  . Elevated LFTs   . History of kidney stones   . Hx of migraines   . Hyperlipidemia    hx of  . Menses, irregular   .  Pineal gland cyst   . Thyroid disease    hx abnormal thyroid levels    Past Surgical History:  Procedure Laterality Date  . KIDNEY STONE SURGERY  2009  . TUBAL LIGATION      Family History  Problem Relation Age of Onset  . Cancer Mother 68    breast cancer  . Diabetes Mother 32  . Hypertension Father    Social History:  reports that she has been smoking Cigarettes.  She has a 20.25 pack-year smoking history. She has never used smokeless tobacco. She reports that she does not drink alcohol or use drugs.  Allergies: No Known Allergies   (Not in a hospital admission)  No results found for this or any previous visit (from the past 48 hour(s)). No results found.  Review of Systems  Constitutional: Negative.   HENT: Negative.   Eyes: Negative.   Respiratory: Negative.   Cardiovascular: Negative.   Gastrointestinal: Negative.   Genitourinary: Negative.   Musculoskeletal: Positive for back pain.  Skin: Negative.   Neurological: Positive for sensory change and focal weakness.  Psychiatric/Behavioral: Negative.     There were no vitals taken for this visit. Physical Exam  Constitutional: She is oriented to person, place, and time. She appears well-developed and well-nourished.  HENT:  Head: Normocephalic.  Eyes: Pupils are equal, round, and reactive to light.  Neck: Normal range of motion.  Cardiovascular: Normal rate.   Respiratory: Effort normal.  GI: Soft.  Musculoskeletal:  On exam, healthy, in  moderate distress, walks with an antalgic gait. Mood and affect is appropriate. Tender left proximal gluteus. Straight leg raise, buttock, thigh and calf pain on the left, negative on the right.  Dr. Noel Geroldohen also reported an L5 radiculopathy today and suggested a possible EMG and nerve conduction study to rule out a neuropathy. Also, he would be more inclined to recommend a fusion due to dynamic foraminal stenosis.  Also, on her exam, she has diminished plantarflexion, trace  EHL weakness. She reports altered sensation in the S1 dermatome.  Neurological: She is alert and oriented to person, place, and time.  Skin: Skin is warm and dry.  Psychiatric: She has a normal mood and affect.    I reviewed her radiographs today. She has a thoracolumbar scoliosis of 14 degrees of T11 to L4.  Her MRI again demonstrates disc herniation at L5-S1.  Assessment/Plan 1. 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 2. 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 3. Thoracolumbar scoliosis. 4. Tobacco dependence. 5. Disc degeneration at 4-5.  I had extensive discussion with [name] concerning current pathology, relevant anatomy, and treatment option. I appreciate the thorough evaluation by Dr. Noel Geroldohen and his discussion with [name].  We had an extensive discussion concerning the pathology, again relevant anatomy and her treatment options.  I do feel that her radicular pain is secondary to her disc herniation at L5-S1. I do feel she has a component of an S1 radiculopathy, but I also feel she has a L5 radiculopathy as well given the secondary effect on the L5 nerve root with a scoliosis and a disc herniation tethering the S1 nerve root. I feel the effect on the L5 nerve root is mainly secondary to traction on the L5 nerve root as oppose to foraminal compression. I therefore feel that decompression of the S1 nerve root will allow normal excursion of the L5 nerve root. I do feel given the underlying thoracolumbar scoliosis, smoking and underlying disc degeneration that she is at risk for continued pathology in the future. Given that there is a possibility of her requiring a fusion in the future, my concern at this point is that she has minimal back pain per se and she has predominant leg pain and a focal disc herniation with dermatomal correlation, I feel a minimally invasive approach at this point would be my preference  to decompress the S1 nerve root and to allow excursion of the L5 nerve root. My concern at this point and augmenting that with a concomitant fusion would be her disc degeneration that is noted at L4-L5 and a fusion of L5-S1 would place an additional biomechanical strain at L4-5 and the concern about adjacent segment disease is one as well. It is difficult in these situations where the proposed procedures are not curative. In a sense, we are making more room for the nerve root as oppose to fixing the disc. I do feel her foramen is fairly capacious. I do not feel that she is having stenosis related to L4-L5. Typically, in these chronic stenotic pathologies, there is a concomitant epidural venous plexus that is tethering of the nerve roots and contributing to the radicular pain and again, certainly, she very well may require a fusion in the future; however, I would recommend that at the point that she declares that she has failed a minimally invasive approach. Certainly, though, after decompression, I would recommend to treat the injured disc and the underlying disc degeneration,  appropriate physical restrictions that would reduce her risk for recurrent disc herniation, namely avoiding unsupportive and repetitive bending, prolonged positioning as well as heavy bending and that could be established by appropriate physical therapy and assigning restrictions obtained in the functional capacity exam. Either way, I would feel her outcome would be improved commanding fully to tobacco cessation. After considerable discussion concerning this, she would like to proceed with microdecompression at L5-S1 only. We discussed the time overnight in the hospital and the previous postoperative course was documented as well. In terms of obtaining an EMG and nerve conduction study, she is not a diabetic, her pain is mainly dermatomal and although any additional information can be helpful in the decision making process, I feel it is  optional, not a prerequisite for the proposed procedure and certainly if she would like to proceed with that, that would be an option. Typically, I would utilize that in individuals who have persistent dermatomal neuropathic pain despite decompression and an adequate postoperative period of reinnervation. This is for evaluating for a battered root syndrome. At this point, it would be unlikely to alter the recommendation for decompression.  Again, we discussed with [name] at length the difficulty with treating lumbar disc disease in that again the procedure proposed is designed to take pressure off of the nerve root, make more room for the nerve root if you will to allow appropriate healing and non-compression to reduce her radicular pain that does not again fix the disc and she will still have a need to contend with disc degeneration at 5-1 and at 4-5. Again, handled with appropriate rehabilitation and work restrictions following the decompression. Again, my concern with the supplemental fusion at this point in the absence of instability is the adjacent segment issue, aggravating her disc degeneration at 4-5.  Again, I appreciate Dr. Wells Guiles evaluation and input. I discussed this in front of the patient with [name], case manager. Again, we will proceed as originally planned with the appropriate discussion and precautions.  Plan microlumbar decompression L5-S1 left  Dorothy Spark., PA-C for Dr. Shelle Iron 07/27/2016, 4:51 PM

## 2016-07-28 NOTE — Patient Instructions (Signed)
Debra LoboRebecca J Adams  07/28/2016   Your procedure is scheduled on: 08/03/16  Report to [facility]  Entrance take [place]  elevators to 3rd floor to  Short Stay Center at      [phone].  Call this number if you have problems the morning of surgery [phone]   Remember: ONLY 1 PERSON MAY GO WITH YOU TO SHORT STAY TO GET  READY MORNING OF YOUR SURGERY.  Do not eat food or drink liquids :After Midnight.     Take these medicines the morning of surgery with A SIP OF WATER: Gabapentin                                You may not have any metal on your body including hair pins and              piercings  Do not wear jewelry, make-up, lotions, powders or perfumes, deodorant             Do not wear nail polish.  Do not shave  48 hours prior to surgery.                Do not bring valuables to the hospital. [facility] IS NOT             RESPONSIBLE   FOR VALUABLES.  Contacts, dentures or bridgework may not be worn into surgery.  Leave suitcase in the car. After surgery it may be brought to your room.                 Please read over the following fact sheets you were given: _____________________________________________________________________             [facility] - Pre[facility] Before surgery, you can play an important role.  Because skin is not sterile, your skin needs to be as free of germs as possible.  You can reduce the number of germs on your skin by washing with CHG (chlorahexidine gluconate) soap before surgery.  CHG is an antiseptic cleaner which kills germs and bonds with the skin to continue killing germs even after washing. Please DO NOT use if you have an allergy to CHG or antibacterial soaps.  If your skin becomes reddened/irritated stop using the CHG and inform your nurse when you arrive at Short Stay. Do not shave (including legs and underarms) for at least 48 hours prior to the first CHG shower.  You may shave your face/neck. Please follow  these instructions carefully:  1.  Shower with CHG Soap the night before surgery and the  morning of Surgery.  2.  If you choose to wash your hair, wash your hair first as usual with your  normal  shampoo.  3.  After you shampoo, rinse your hair and body thoroughly to remove the  shampoo.                           4.  Use CHG as you would any other liquid soap.  You can apply chg directly  to the skin and wash                       Gently with a scrungie or clean washcloth.  5.  Apply the CHG Soap to your body  ONLY FROM THE NECK DOWN.   Do not use on face/ open                           Wound or open sores. Avoid contact with eyes, ears mouth and genitals (private parts).                       Wash face,  Genitals (private parts) with your normal soap.             6.  Wash thoroughly, paying special attention to the area where your surgery  will be performed.  7.  Thoroughly rinse your body with warm water from the neck down.  8.  DO NOT shower/wash with your normal soap after using and rinsing off  the CHG Soap.                9.  Pat yourself dry with a clean towel.            10.  Wear clean pajamas.            11.  Place clean sheets on your bed the night of your first shower and do not  sleep with pets. Day of Surgery : Do not apply any lotions/deodorants the morning of surgery.  Please wear clean clothes to the hospital/surgery center.  FAILURE TO FOLLOW THESE INSTRUCTIONS MAY RESULT IN THE CANCELLATION OF YOUR SURGERY PATIENT SIGNATURE_________________________________  NURSE SIGNATURE__________________________________  ________________________________________________________________________   [name]  An incentive spirometer is a tool that can help keep your lungs clear and active. This tool measures how well you are filling your lungs with each breath. Taking long deep breaths may help reverse or decrease the chance of developing breathing (pulmonary) problems  (especially infection) following:  A long period of time when you are unable to move or be active. BEFORE THE PROCEDURE   If the spirometer includes an indicator to show your best effort, your nurse or respiratory therapist will set it to a desired goal.  If possible, sit up straight or lean slightly forward. Try not to slouch.  Hold the incentive spirometer in an upright position. INSTRUCTIONS FOR USE  1. Sit on the edge of your bed if possible, or sit up as far as you can in bed or on a chair. 2. Hold the incentive spirometer in an upright position. 3. Breathe out normally. 4. Place the mouthpiece in your mouth and seal your lips tightly around it. 5. Breathe in slowly and as deeply as possible, raising the piston or the ball toward the top of the column. 6. Hold your breath for 3-5 seconds or for as long as possible. Allow the piston or ball to fall to the bottom of the column. 7. Remove the mouthpiece from your mouth and breathe out normally. 8. Rest for a few seconds and repeat Steps 1 through 7 at least 10 times every 1-2 hours when you are awake. Take your time and take a few normal breaths between deep breaths. 9. The spirometer may include an indicator to show your best effort. Use the indicator as a goal to work toward during each repetition. 10. After each set of 10 deep breaths, practice coughing to be sure your lungs are clear. If you have an incision (the cut made at the time of surgery), support your incision when coughing by placing a pillow or rolled up towels firmly against it. Once  you are able to get out of bed, walk around indoors and cough well. You may stop using the incentive spirometer when instructed by your caregiver.  RISKS AND COMPLICATIONS  Take your time so you do not get dizzy or light-headed.  If you are in pain, you may need to take or ask for pain medication before doing incentive spirometry. It is harder to take a deep breath if you are having  pain. AFTER USE  Rest and breathe slowly and easily.  It can be helpful to keep track of a log of your progress. Your caregiver can provide you with a simple table to help with this. If you are using the spirometer at home, follow these instructions: [place] IF:   You are having difficultly using the spirometer.  You have trouble using the spirometer as often as instructed.  Your pain medication is not giving enough relief while using the spirometer.  You develop fever of 100.5 F (38.1 C) or higher. SEEK IMMEDIATE MEDICAL CARE IF:   You cough up bloody sputum that had not been present before.  You develop fever of 102 F (38.9 C) or greater.  You develop worsening pain at or near the incision site. MAKE SURE YOU:   Understand these instructions.  Will watch your condition.  Will get help right away if you are not doing well or get worse. Document Released: 09/05/2006 Document Revised: 07/18/2011 Document Reviewed: 11/06/2006 [facility] Patient Information 2014 [place], [place].   ________________________________________________________________________

## 2016-08-01 ENCOUNTER — Encounter (HOSPITAL_COMMUNITY): Payer: Self-pay

## 2016-08-01 ENCOUNTER — Encounter (HOSPITAL_COMMUNITY)
Admission: RE | Admit: 2016-08-01 | Discharge: 2016-08-01 | Disposition: A | Discharge status: Home / Self Care | Payer: Worker's Compensation | Source: Ambulatory Visit | Attending: Specialist | Admitting: Specialist

## 2016-08-01 ENCOUNTER — Ambulatory Visit (HOSPITAL_COMMUNITY)
Admission: RE | Admit: 2016-08-01 | Discharge: 2016-08-01 | Disposition: A | Discharge status: Home / Self Care | Payer: Worker's Compensation | Source: Ambulatory Visit | Attending: Orthopedic Surgery | Admitting: Orthopedic Surgery

## 2016-08-01 DIAGNOSIS — M5126 Other intervertebral disc displacement, lumbar region: Secondary | ICD-10-CM | POA: Diagnosis present

## 2016-08-01 DIAGNOSIS — M419 Scoliosis, unspecified: Secondary | ICD-10-CM | POA: Insufficient documentation

## 2016-08-01 DIAGNOSIS — M5136 Other intervertebral disc degeneration, lumbar region: Secondary | ICD-10-CM | POA: Diagnosis not present

## 2016-08-01 HISTORY — DX: Headache: R51

## 2016-08-01 HISTORY — DX: Headache, unspecified: R51.9

## 2016-08-01 HISTORY — DX: Family history of other specified conditions: Z84.89

## 2016-08-01 HISTORY — DX: Adverse effect of unspecified anesthetic, initial encounter: T41.45XA

## 2016-08-01 HISTORY — DX: Other complications of anesthesia, initial encounter: T88.59XA

## 2016-08-01 HISTORY — DX: Anxiety disorder, unspecified: F41.9

## 2016-08-01 LAB — BASIC METABOLIC PANEL
Anion gap: 8 (ref 5–15)
BUN: 28 mg/dL — ABNORMAL HIGH (ref 6–20)
CHLORIDE: 107 mmol/L (ref 101–111)
CO2: 22 mmol/L (ref 22–32)
Calcium: 9.2 mg/dL (ref 8.9–10.3)
Creatinine, Ser: 0.71 mg/dL (ref 0.44–1.00)
GFR calc non Af Amer: >60 mL/min (ref >60)
Glucose, Bld: 94 mg/dL (ref 65–99)
Potassium: 3.8 mmol/L (ref 3.5–5.1)
SODIUM: 137 mmol/L (ref 135–145)

## 2016-08-01 LAB — CBC
HEMATOCRIT: 44.5 % (ref 36.0–46.0)
Hemoglobin: 15 g/dL (ref 12.0–15.0)
MCH: 29.2 pg (ref 26.0–34.0)
MCHC: 33.7 g/dL (ref 30.0–36.0)
MCV: 86.6 fL (ref 78.0–100.0)
PLATELETS: 330 10*3/uL (ref 150–400)
RBC: 5.14 MIL/uL — AB (ref 3.87–5.11)
RDW: 13.1 % (ref 11.5–15.5)
WBC: 10.8 10*3/uL — AB (ref 4.0–10.5)

## 2016-08-01 LAB — SURGICAL PCR SCREEN
MRSA, PCR: NEGATIVE
Staphylococcus aureus: NEGATIVE

## 2016-08-01 NOTE — Progress Notes (Signed)
BMP done3/26 routed to Dr. Shelle IronBeane in epic

## 2016-08-01 NOTE — Progress Notes (Signed)
cxr 12/18/15 epic

## 2016-08-03 ENCOUNTER — Ambulatory Visit (HOSPITAL_COMMUNITY): Payer: Worker's Compensation | Admitting: Registered Nurse

## 2016-08-03 ENCOUNTER — Ambulatory Visit (HOSPITAL_COMMUNITY): Payer: Worker's Compensation

## 2016-08-03 ENCOUNTER — Encounter (HOSPITAL_COMMUNITY)
Admission: RE | Disposition: A | Discharge status: Home / Self Care | Payer: Self-pay | Source: Ambulatory Visit | Attending: Specialist

## 2016-08-03 ENCOUNTER — Encounter (HOSPITAL_COMMUNITY): Payer: Self-pay

## 2016-08-03 ENCOUNTER — Ambulatory Visit (HOSPITAL_COMMUNITY)
Admission: RE | Admit: 2016-08-03 | Discharge: 2016-08-04 | Disposition: A | Discharge status: Home / Self Care | Payer: Worker's Compensation | Source: Ambulatory Visit | Attending: Specialist | Admitting: Specialist

## 2016-08-03 DIAGNOSIS — Z833 Family history of diabetes mellitus: Secondary | ICD-10-CM | POA: Insufficient documentation

## 2016-08-03 DIAGNOSIS — Z87442 Personal history of urinary calculi: Secondary | ICD-10-CM | POA: Insufficient documentation

## 2016-08-03 DIAGNOSIS — Z803 Family history of malignant neoplasm of breast: Secondary | ICD-10-CM | POA: Diagnosis not present

## 2016-08-03 DIAGNOSIS — E348 Other specified endocrine disorders: Secondary | ICD-10-CM | POA: Diagnosis not present

## 2016-08-03 DIAGNOSIS — M419 Scoliosis, unspecified: Secondary | ICD-10-CM | POA: Insufficient documentation

## 2016-08-03 DIAGNOSIS — M4807 Spinal stenosis, lumbosacral region: Secondary | ICD-10-CM | POA: Diagnosis not present

## 2016-08-03 DIAGNOSIS — Z8249 Family history of ischemic heart disease and other diseases of the circulatory system: Secondary | ICD-10-CM | POA: Insufficient documentation

## 2016-08-03 DIAGNOSIS — F17210 Nicotine dependence, cigarettes, uncomplicated: Secondary | ICD-10-CM | POA: Insufficient documentation

## 2016-08-03 DIAGNOSIS — Z419 Encounter for procedure for purposes other than remedying health state, unspecified: Secondary | ICD-10-CM

## 2016-08-03 DIAGNOSIS — G43909 Migraine, unspecified, not intractable, without status migrainosus: Secondary | ICD-10-CM | POA: Insufficient documentation

## 2016-08-03 DIAGNOSIS — R7989 Other specified abnormal findings of blood chemistry: Secondary | ICD-10-CM | POA: Diagnosis not present

## 2016-08-03 DIAGNOSIS — F419 Anxiety disorder, unspecified: Secondary | ICD-10-CM | POA: Diagnosis not present

## 2016-08-03 DIAGNOSIS — Z7982 Long term (current) use of aspirin: Secondary | ICD-10-CM | POA: Diagnosis not present

## 2016-08-03 DIAGNOSIS — E039 Hypothyroidism, unspecified: Secondary | ICD-10-CM | POA: Insufficient documentation

## 2016-08-03 DIAGNOSIS — M5127 Other intervertebral disc displacement, lumbosacral region: Secondary | ICD-10-CM | POA: Diagnosis present

## 2016-08-03 DIAGNOSIS — E785 Hyperlipidemia, unspecified: Secondary | ICD-10-CM | POA: Insufficient documentation

## 2016-08-03 DIAGNOSIS — M4186 Other forms of scoliosis, lumbar region: Secondary | ICD-10-CM | POA: Diagnosis not present

## 2016-08-03 DIAGNOSIS — M5126 Other intervertebral disc displacement, lumbar region: Secondary | ICD-10-CM

## 2016-08-03 HISTORY — PX: LUMBAR LAMINECTOMY/DECOMPRESSION MICRODISCECTOMY: SHX5026

## 2016-08-03 SURGERY — LUMBAR LAMINECTOMY/DECOMPRESSION MICRODISCECTOMY 1 LEVEL
Anesthesia: General | Site: Back | Laterality: Left

## 2016-08-03 MED ORDER — FENTANYL CITRATE (PF) 100 MCG/2ML IJ SOLN
INTRAMUSCULAR | Status: DC | PRN
  Administered 2016-08-03 (×2): 50 ug via INTRAVENOUS

## 2016-08-03 MED ORDER — SODIUM CHLORIDE 0.9 % IV SOLN
INTRAVENOUS | Status: DC | PRN
  Administered 2016-08-03: 10 ug/min via INTRAVENOUS

## 2016-08-03 MED ORDER — MIDAZOLAM HCL 2 MG/2ML IJ SOLN
INTRAMUSCULAR | Status: AC
  Filled 2016-08-03: qty 2

## 2016-08-03 MED ORDER — POLYETHYLENE GLYCOL 3350 17 G PO PACK: 17.0000 g | PACK | Freq: Every day | ORAL | 0 refills | Status: AC

## 2016-08-03 MED ORDER — ROCURONIUM BROMIDE 50 MG/5ML IV SOSY
PREFILLED_SYRINGE | INTRAVENOUS | Status: AC
Start: 2016-08-03 — End: 2016-08-03
  Filled 2016-08-03: qty 5

## 2016-08-03 MED ORDER — PROMETHAZINE HCL 25 MG/ML IJ SOLN
6.2500 mg | Freq: Four times a day (QID) | INTRAMUSCULAR | Status: DC | PRN
  Administered 2016-08-03: 6.25 mg via INTRAVENOUS
  Filled 2016-08-03: qty 1

## 2016-08-03 MED ORDER — ONDANSETRON HCL 4 MG/2ML IJ SOLN
INTRAMUSCULAR | Status: AC
  Filled 2016-08-03: qty 2

## 2016-08-03 MED ORDER — ACETAMINOPHEN 325 MG PO TABS: 650.0000 mg | ORAL_TABLET | ORAL | Status: DC | PRN

## 2016-08-03 MED ORDER — DEXAMETHASONE SODIUM PHOSPHATE 10 MG/ML IJ SOLN
INTRAMUSCULAR | Status: AC
  Filled 2016-08-03: qty 1

## 2016-08-03 MED ORDER — PHENYLEPHRINE 40 MCG/ML (10ML) SYRINGE FOR IV PUSH (FOR BLOOD PRESSURE SUPPORT)
PREFILLED_SYRINGE | INTRAVENOUS | Status: AC
  Filled 2016-08-03: qty 10

## 2016-08-03 MED ORDER — CEFAZOLIN SODIUM-DEXTROSE 2-4 GM/100ML-% IV SOLN
2.0000 g | Freq: Three times a day (TID) | INTRAVENOUS | Status: AC
  Administered 2016-08-03: 2 g via INTRAVENOUS
  Filled 2016-08-03: qty 100

## 2016-08-03 MED ORDER — MENTHOL 3 MG MT LOZG: 1.0000 | LOZENGE | OROMUCOSAL | Status: DC | PRN

## 2016-08-03 MED ORDER — FENTANYL CITRATE (PF) 100 MCG/2ML IJ SOLN
INTRAMUSCULAR | Status: AC
  Filled 2016-08-03: qty 2

## 2016-08-03 MED ORDER — METHOCARBAMOL 1000 MG/10ML IJ SOLN
500.0000 mg | Freq: Four times a day (QID) | INTRAVENOUS | Status: DC | PRN
  Administered 2016-08-03: 500 mg via INTRAVENOUS
  Filled 2016-08-03: qty 5
  Filled 2016-08-03: qty 550

## 2016-08-03 MED ORDER — SUGAMMADEX SODIUM 200 MG/2ML IV SOLN
INTRAVENOUS | Status: DC | PRN
  Administered 2016-08-03: 150 mg via INTRAVENOUS

## 2016-08-03 MED ORDER — LACTATED RINGERS IV SOLN
INTRAVENOUS | Status: DC
  Administered 2016-08-03: 07:00:00 via INTRAVENOUS

## 2016-08-03 MED ORDER — PROPOFOL 10 MG/ML IV BOLUS
INTRAVENOUS | Status: DC | PRN
  Administered 2016-08-03: 100 mg via INTRAVENOUS

## 2016-08-03 MED ORDER — THROMBIN 5000 UNITS EX SOLR
OROMUCOSAL | Status: DC | PRN
  Administered 2016-08-03: 10 mL via TOPICAL

## 2016-08-03 MED ORDER — PROMETHAZINE HCL 25 MG/ML IJ SOLN: 6.2500 mg | INTRAMUSCULAR | Status: DC | PRN

## 2016-08-03 MED ORDER — PHENYLEPHRINE HCL 10 MG/ML IJ SOLN
INTRAMUSCULAR | Status: AC
  Filled 2016-08-03: qty 1

## 2016-08-03 MED ORDER — OXYCODONE-ACETAMINOPHEN 5-325 MG PO TABS: 1.0000 | ORAL_TABLET | ORAL | 0 refills | Status: AC | PRN

## 2016-08-03 MED ORDER — ACETAMINOPHEN 10 MG/ML IV SOLN
INTRAVENOUS | Status: AC
  Filled 2016-08-03: qty 100

## 2016-08-03 MED ORDER — BUPIVACAINE-EPINEPHRINE 0.5% -1:200000 IJ SOLN
INTRAMUSCULAR | Status: DC | PRN
  Administered 2016-08-03: 15 mL

## 2016-08-03 MED ORDER — DEXAMETHASONE SODIUM PHOSPHATE 10 MG/ML IJ SOLN
INTRAMUSCULAR | Status: DC | PRN
  Administered 2016-08-03: 10 mg via INTRAVENOUS

## 2016-08-03 MED ORDER — BISACODYL 5 MG PO TBEC: 5.0000 mg | DELAYED_RELEASE_TABLET | Freq: Every day | ORAL | Status: DC | PRN

## 2016-08-03 MED ORDER — HYDROMORPHONE HCL 1 MG/ML IJ SOLN
INTRAMUSCULAR | Status: AC
  Administered 2016-08-03: 0.25 mg via INTRAVENOUS
  Filled 2016-08-03: qty 1

## 2016-08-03 MED ORDER — SODIUM CHLORIDE 0.9 % IR SOLN
Status: AC
  Filled 2016-08-03: qty 500000

## 2016-08-03 MED ORDER — ONDANSETRON HCL 4 MG/2ML IJ SOLN
INTRAMUSCULAR | Status: DC | PRN
  Administered 2016-08-03: 4 mg via INTRAVENOUS

## 2016-08-03 MED ORDER — RISAQUAD PO CAPS
1.0000 | ORAL_CAPSULE | Freq: Every day | ORAL | Status: DC
  Administered 2016-08-04: 1 via ORAL
  Filled 2016-08-03 (×2): qty 1

## 2016-08-03 MED ORDER — BUPIVACAINE-EPINEPHRINE (PF) 0.5% -1:200000 IJ SOLN
INTRAMUSCULAR | Status: AC
Start: 2016-08-03 — End: 2016-08-03
  Filled 2016-08-03: qty 30

## 2016-08-03 MED ORDER — LIDOCAINE 2% (20 MG/ML) 5 ML SYRINGE
INTRAMUSCULAR | Status: AC
Start: 2016-08-03 — End: 2016-08-03
  Filled 2016-08-03: qty 5

## 2016-08-03 MED ORDER — PHENOL 1.4 % MT LIQD: 1.0000 | OROMUCOSAL | Status: DC | PRN

## 2016-08-03 MED ORDER — MIDAZOLAM HCL 5 MG/5ML IJ SOLN
INTRAMUSCULAR | Status: DC | PRN
  Administered 2016-08-03: 2 mg via INTRAVENOUS

## 2016-08-03 MED ORDER — ONDANSETRON HCL 4 MG PO TABS: 4.0000 mg | ORAL_TABLET | Freq: Four times a day (QID) | ORAL | Status: DC | PRN

## 2016-08-03 MED ORDER — CEFAZOLIN SODIUM-DEXTROSE 2-4 GM/100ML-% IV SOLN
INTRAVENOUS | Status: AC
  Filled 2016-08-03: qty 100

## 2016-08-03 MED ORDER — METHOCARBAMOL 500 MG PO TABS
500.0000 mg | ORAL_TABLET | Freq: Four times a day (QID) | ORAL | Status: DC | PRN
  Administered 2016-08-04 (×2): 500 mg via ORAL
  Filled 2016-08-03: qty 1

## 2016-08-03 MED ORDER — DOCUSATE SODIUM 100 MG PO CAPS: 100.0000 mg | ORAL_CAPSULE | Freq: Two times a day (BID) | ORAL | 1 refills | Status: AC | PRN

## 2016-08-03 MED ORDER — KCL IN DEXTROSE-NACL 20-5-0.45 MEQ/L-%-% IV SOLN
INTRAVENOUS | Status: AC
Start: 2016-08-03 — End: 2016-08-04
  Administered 2016-08-03: 12:00:00 via INTRAVENOUS
  Filled 2016-08-03 (×2): qty 1000

## 2016-08-03 MED ORDER — ACETAMINOPHEN 650 MG RE SUPP: 650.0000 mg | RECTAL | Status: DC | PRN

## 2016-08-03 MED ORDER — CEFAZOLIN SODIUM-DEXTROSE 2-4 GM/100ML-% IV SOLN
2.0000 g | INTRAVENOUS | Status: AC
  Administered 2016-08-03: 2 g via INTRAVENOUS

## 2016-08-03 MED ORDER — HYDROMORPHONE HCL 1 MG/ML IJ SOLN
0.2500 mg | INTRAMUSCULAR | Status: DC | PRN
  Administered 2016-08-03: 0.5 mg via INTRAVENOUS
  Administered 2016-08-03: 0.25 mg via INTRAVENOUS
  Administered 2016-08-03: 0.5 mg via INTRAVENOUS
  Administered 2016-08-03 (×2): 0.25 mg via INTRAVENOUS

## 2016-08-03 MED ORDER — ALUM & MAG HYDROXIDE-SIMETH 200-200-20 MG/5ML PO SUSP: 30.0000 mL | Freq: Four times a day (QID) | ORAL | Status: DC | PRN

## 2016-08-03 MED ORDER — SODIUM CHLORIDE 0.9 % IR SOLN
Status: DC | PRN
  Administered 2016-08-03: 500 mL

## 2016-08-03 MED ORDER — THROMBIN 5000 UNITS EX SOLR
CUTANEOUS | Status: AC
  Filled 2016-08-03: qty 10000

## 2016-08-03 MED ORDER — GABAPENTIN 300 MG PO CAPS
300.0000 mg | ORAL_CAPSULE | Freq: Three times a day (TID) | ORAL | Status: DC
  Administered 2016-08-03 – 2016-08-04 (×2): 300 mg via ORAL
  Filled 2016-08-03 (×3): qty 1

## 2016-08-03 MED ORDER — OXYCODONE HCL 5 MG PO TABS
5.0000 mg | ORAL_TABLET | ORAL | Status: DC | PRN
  Administered 2016-08-03: 10 mg via ORAL
  Administered 2016-08-03 (×2): 5 mg via ORAL
  Administered 2016-08-04 (×3): 10 mg via ORAL
  Filled 2016-08-03 (×3): qty 2
  Filled 2016-08-03: qty 1
  Filled 2016-08-03: qty 2
  Filled 2016-08-03: qty 1

## 2016-08-03 MED ORDER — METHOCARBAMOL 500 MG PO TABS: 500.0000 mg | ORAL_TABLET | Freq: Four times a day (QID) | ORAL | 1 refills | Status: AC | PRN

## 2016-08-03 MED ORDER — HYDROMORPHONE HCL 1 MG/ML IJ SOLN
INTRAMUSCULAR | Status: AC
  Administered 2016-08-03: 0.5 mg via INTRAVENOUS
  Filled 2016-08-03: qty 2

## 2016-08-03 MED ORDER — LIDOCAINE 2% (20 MG/ML) 5 ML SYRINGE
INTRAMUSCULAR | Status: DC | PRN
  Administered 2016-08-03: 70 mg via INTRAVENOUS

## 2016-08-03 MED ORDER — PROPOFOL 10 MG/ML IV BOLUS
INTRAVENOUS | Status: AC
  Filled 2016-08-03: qty 20

## 2016-08-03 MED ORDER — POLYETHYLENE GLYCOL 3350 17 G PO PACK: 17.0000 g | PACK | Freq: Every day | ORAL | Status: DC | PRN

## 2016-08-03 MED ORDER — ONDANSETRON HCL 4 MG/2ML IJ SOLN
4.0000 mg | Freq: Four times a day (QID) | INTRAMUSCULAR | Status: DC | PRN
  Administered 2016-08-03: 4 mg via INTRAVENOUS
  Filled 2016-08-03: qty 2

## 2016-08-03 MED ORDER — ACETAMINOPHEN 10 MG/ML IV SOLN
1000.0000 mg | Freq: Four times a day (QID) | INTRAVENOUS | Status: DC
  Administered 2016-08-03: 1000 mg via INTRAVENOUS

## 2016-08-03 MED ORDER — HYDROMORPHONE HCL 1 MG/ML IJ SOLN: 1.0000 mg | INTRAMUSCULAR | Status: DC | PRN

## 2016-08-03 MED ORDER — MAGNESIUM CITRATE PO SOLN: 1.0000 | Freq: Once | ORAL | Status: DC | PRN

## 2016-08-03 MED ORDER — ROCURONIUM BROMIDE 10 MG/ML (PF) SYRINGE
PREFILLED_SYRINGE | INTRAVENOUS | Status: DC | PRN
  Administered 2016-08-03: 50 mg via INTRAVENOUS

## 2016-08-03 MED ORDER — DOCUSATE SODIUM 100 MG PO CAPS
100.0000 mg | ORAL_CAPSULE | Freq: Two times a day (BID) | ORAL | Status: DC
  Administered 2016-08-03 – 2016-08-04 (×2): 100 mg via ORAL
  Filled 2016-08-03 (×2): qty 1

## 2016-08-03 MED ORDER — PHENYLEPHRINE 40 MCG/ML (10ML) SYRINGE FOR IV PUSH (FOR BLOOD PRESSURE SUPPORT)
PREFILLED_SYRINGE | INTRAVENOUS | Status: DC | PRN
  Administered 2016-08-03: 40 ug via INTRAVENOUS
  Administered 2016-08-03: 120 ug via INTRAVENOUS
  Administered 2016-08-03: 40 ug via INTRAVENOUS
  Administered 2016-08-03: 80 ug via INTRAVENOUS

## 2016-08-03 SURGICAL SUPPLY — 47 items
BAG ZIPLOCK 12X15 (MISCELLANEOUS) IMPLANT
CLEANER TIP ELECTROSURG 2X2 (MISCELLANEOUS) ×2 IMPLANT
CLOTH 2% CHLOROHEXIDINE 3PK (PERSONAL CARE ITEMS) ×2 IMPLANT
DRAPE MICROSCOPE LEICA (MISCELLANEOUS) ×2 IMPLANT
DRAPE POUCH INSTRU U-SHP 10X18 (DRAPES) ×2 IMPLANT
DRAPE SHEET LG 3/4 BI-LAMINATE (DRAPES) ×2 IMPLANT
DRAPE SURG 17X11 SM STRL (DRAPES) ×2 IMPLANT
DRAPE UTILITY XL STRL (DRAPES) ×2 IMPLANT
DRSG AQUACEL AG ADV 3.5X 4 (GAUZE/BANDAGES/DRESSINGS) ×2 IMPLANT
DRSG AQUACEL AG ADV 3.5X 6 (GAUZE/BANDAGES/DRESSINGS) IMPLANT
DURAPREP 26ML APPLICATOR (WOUND CARE) ×2 IMPLANT
DURASEAL SPINE SEALANT 3ML (MISCELLANEOUS) IMPLANT
ELECT BLADE TIP CTD 4 INCH (ELECTRODE) IMPLANT
ELECT REM PT RETURN 15FT ADLT (MISCELLANEOUS) ×2 IMPLANT
GLOVE BIOGEL PI IND STRL 7.0 (GLOVE) ×1 IMPLANT
GLOVE BIOGEL PI INDICATOR 7.0 (GLOVE) ×1
GLOVE SURG SS PI 7.0 STRL IVOR (GLOVE) ×2 IMPLANT
GLOVE SURG SS PI 7.5 STRL IVOR (GLOVE) ×2 IMPLANT
GLOVE SURG SS PI 8.0 STRL IVOR (GLOVE) ×4 IMPLANT
GOWN STRL REUS W/TWL XL LVL3 (GOWN DISPOSABLE) ×4 IMPLANT
HEMOSTAT SPONGE AVITENE ULTRA (HEMOSTASIS) IMPLANT
IV CATH 14GX2 1/4 (CATHETERS) ×2 IMPLANT
KIT BASIN OR (CUSTOM PROCEDURE TRAY) ×2 IMPLANT
KIT POSITIONING SURG ANDREWS (MISCELLANEOUS) ×2 IMPLANT
MANIFOLD NEPTUNE II (INSTRUMENTS) ×2 IMPLANT
NEEDLE SPNL 18GX3.5 QUINCKE PK (NEEDLE) ×4 IMPLANT
PACK LAMINECTOMY ORTHO (CUSTOM PROCEDURE TRAY) ×2 IMPLANT
PATTIES SURGICAL .5 X.5 (GAUZE/BANDAGES/DRESSINGS) IMPLANT
PATTIES SURGICAL .75X.75 (GAUZE/BANDAGES/DRESSINGS) IMPLANT
PATTIES SURGICAL 1X1 (DISPOSABLE) IMPLANT
RUBBERBAND STERILE (MISCELLANEOUS) ×4 IMPLANT
SPONGE SURGIFOAM ABS GEL 100 (HEMOSTASIS) ×2 IMPLANT
STAPLER VISISTAT (STAPLE) IMPLANT
STRIP CLOSURE SKIN 1/2X4 (GAUZE/BANDAGES/DRESSINGS) ×2 IMPLANT
SUT NURALON 4 0 TR CR/8 (SUTURE) IMPLANT
SUT PROLENE 3 0 PS 2 (SUTURE) IMPLANT
SUT VIC AB 1 CT1 27 (SUTURE)
SUT VIC AB 1 CT1 27XBRD ANTBC (SUTURE) IMPLANT
SUT VIC AB 1-0 CT2 27 (SUTURE) ×2 IMPLANT
SUT VIC AB 2-0 CT1 27 (SUTURE)
SUT VIC AB 2-0 CT1 TAPERPNT 27 (SUTURE) IMPLANT
SUT VIC AB 2-0 CT2 27 (SUTURE) ×2 IMPLANT
SYR 3ML LL SCALE MARK (SYRINGE) IMPLANT
TAPE STRIPS DRAPE STRL (GAUZE/BANDAGES/DRESSINGS) ×2 IMPLANT
TOWEL OR 17X26 10 PK STRL BLUE (TOWEL DISPOSABLE) ×2 IMPLANT
TOWEL OR NON WOVEN STRL DISP B (DISPOSABLE) IMPLANT
YANKAUER SUCT BULB TIP NO VENT (SUCTIONS) IMPLANT

## 2016-08-03 NOTE — Transfer of Care (Signed)
Immediate Anesthesia Transfer of Care Note  Patient: Debra LoboRebecca J Adams  Procedure(s) Performed: Procedure(s): Microlumbar decompression L5-S1 left (Left)  Patient Location: PACU  Anesthesia Type:General  Level of Consciousness: awake, alert , oriented and patient cooperative  Airway & Oxygen Therapy: Patient Spontanous Breathing and Patient connected to face mask oxygen  Post-op Assessment: Report given to RN, Post -op Vital signs reviewed and stable and Patient moving all extremities  Post vital signs: Reviewed and stable  Last Vitals:  Vitals:   08/03/16 0521  BP: 107/83  Pulse: 95  Resp: 16  Temp: 36.3 C    Last Pain:  Vitals:   08/03/16 0539  TempSrc:   PainSc: 5       Patients Stated Pain Goal: 5 (08/03/16 0539)  Complications: No apparent anesthesia complications

## 2016-08-03 NOTE — Anesthesia Postprocedure Evaluation (Addendum)
Anesthesia Post Note  Patient: Debra LoboRebecca J Adams  Procedure(s) Performed: Procedure(s) (LRB): Microlumbar decompression L5-S1 left (Left)  Patient location during evaluation: PACU Anesthesia Type: General Level of consciousness: awake and alert Pain management: pain level controlled Vital Signs Assessment: post-procedure vital signs reviewed and stable Respiratory status: spontaneous breathing, nonlabored ventilation, respiratory function stable and patient connected to nasal cannula oxygen Cardiovascular status: blood pressure returned to baseline and stable Postop Assessment: no signs of nausea or vomiting Anesthetic complications: no       Last Vitals:  Vitals:   08/03/16 0930 08/03/16 0945  BP: 112/81 109/76  Pulse: 90 89  Resp: 16 (!) 31  Temp:      Last Pain:  Vitals:   08/03/16 0945  TempSrc:   PainSc: 3     LLE Motor Response: Purposeful movement (08/03/16 0945) LLE Sensation: Decreased (08/03/16 0945) RLE Motor Response: Purposeful movement (08/03/16 0945) RLE Sensation: Full sensation (08/03/16 0945)      Tory Mckissack S

## 2016-08-03 NOTE — Interval H&P Note (Signed)
History and Physical Interval Note:  08/03/2016 7:19 AM  Randye Loboebecca J Scarpati  has presented today for surgery, with the diagnosis of HNP L5-S1 left  The various methods of treatment have been discussed with the patient and family. After consideration of risks, benefits and other options for treatment, the patient has consented to  Procedure(s): Microlumbar decompression L5-S1 left (Left) as a surgical intervention .  The patient's history has been reviewed, patient examined, no change in status, stable for surgery.  I have reviewed the patient's chart and labs.  Questions were answered to the patient's satisfaction.     Christabella Alvira C

## 2016-08-03 NOTE — Op Note (Signed)
NAME:  [name], [name]                   ACCOUNT NO.:  MEDICAL RECORD NO.:  [id number]  LOCATION:                                 FACILITY:  PHYSICIAN:  Jene EveryJeffrey Shaquoya Cosper, M.D.         DATE OF BIRTH:  DATE OF PROCEDURE:  08/03/2016 DATE OF DISCHARGE:                              OPERATIVE REPORT   PREOPERATIVE DIAGNOSIS:  Spinal stenosis and herniated nucleus pulposus, L5-S1, left.  POSTOPERATIVE DIAGNOSES: 1. Spinal stenosis and herniated nucleus pulposus, L5-S1, left. 2. Epidural venous plexus, L5-S1, left.  PROCEDURE PERFORMED: 1. Microlumbar decompression at L5-S1, left. 2. Foraminotomies, L5-S1, left. 3. Microdiskectomy, L5-S1, left. 4. Lysis of epidural venous plexus, L5-S1, left.  BRIEF HISTORY:  This is a 45, S1 radiculopathy secondary to disk herniation, lateral recess stenosis.  She had underlying lumbar scoliosis, disk degenerated at L4-L5, L5-S1.  No back pain or leg pain. Fracture, rest, activity modification, epidural steroid injections were of temporary relief.  MRI indicating disk herniation compressing the S1 nerve root.  We discussed microlumbar decompression, L5-S1.  Risk and benefits discussed including bleeding, infection, damage to the neurovascular structures, no changes in worsening symptoms, DVT, PE, anesthetic complications, need for fusion in future, etc.  TECHNIQUE:  With the patient in supine position, after induction of adequate general anesthesia, 2 g Kefzol, placed prone on the [place] frame.  All bony prominences were well padded.  Lumbar region was prepped and draped in usual sterile fashion.  Two 18-gauge spinal needles were utilized to localize the L5-S1 interspace, confirmed with x- ray.  Incision was made from spinous process L5-S1, 2 cm.  Subcutaneous tissue was dissected.  Electrocautery was utilized to achieve hemostasis.  Marcaine 0.25% with epinephrine was infiltrated in the perimuscular tissue.  Divided the fascia in line with the skin  incision. Dilated to the self-retaining retractor, Archer AsaMcCullough.  Operating microscope was draped and brought on the surgical field after confirmatory radiograph obtained.  Next, ligamentum flavum detached from the cephalad edge of S1 utilizing a straight micro curette.  Neuro patty placed beneath the ligamentum flavum.  Ligamentum flavum, partial removed from the interspace laterally.  I performed a foraminotomy of the S1, identified the S1 nerve root, compressed into the lateral recess with an epidural venous plexus tethering the nerve root.  I gently mobilized the S1 nerve root medially.  The epidural venous plexus was cauterized and divided further freeing the nerve root.  With the nerve root gently medialized, there were 2 additional veins adhered to the S1 nerve root.  It was cauterized and divided.  I then obtained a confirmatory radiograph at L5-S1.  A small foraminotomy of L5 was performed as well.  A small disk herniation noted.  I incised the annulus longitudinally and mobilized the subannular disk herniation with a nerve hook and the 4 separate fragments were removed.  Did not curette the endplates.  Lavaged the disk space with antibiotic irrigation.  By catheter lavage, 2 additional fragments were retrieved.  Following this, there is no residual disk herniation.  I checked in the axilla of the root, the shoulder of the root, the foramen of L5 and S1.  No  residual disk herniation.  Neural probe passed above the pedicle of L5 below the pedicle of S1 and out the foramen of L5.  A 1 cm of excursion of the S1 nerve root medial pedicle without tension was noted.  No active bleeding or CSF leakage. Copiously irrigated the wound.  No evidence of CSF leakage or active bleeding.  I draped epidural adipose tissue over the S1 nerve root.  We removed the [facility] retractor.  No active bleeding.  Copiously irrigated the wound.  We closed the dorsolumbar fascia with 1-0 Vicryl, subcu with  2-0, skin with Prolene.  Sterile dressing applied.  Placed supine on the hospital bed, extubated without difficulty, and transported to the recovery room in satisfactory condition.  The patient tolerated the procedure well.  No complications.  Assistant, Lanna Poche, PA, was used throughout the case for the patient positioning, gentle intermittent neural traction, closure, etc.     Jene Every, M.D.     Cordelia Pen  D:  08/03/2016  T:  08/03/2016  Job:  [id number]

## 2016-08-03 NOTE — H&P (View-Only) (Signed)
[name] is an 46 y.o. female.   Chief Complaint: back and left leg pain HPI: The patient is a 46 year old female who presents today for follow up of their back. The patient is being followed for their back pain. They are now 7 month(s) out from when symptoms began (DOI 12/21/15). Symptoms reported today include: pain. Current treatment includes: activity modification, NSAIDs and pain medications. The following medication has been used for pain control: gabapentin and meloxicam. The patient presents today following a second opinion by Dr. Noel Gerold. Note for "Follow-up back": The patient was placed on a 10lb. lifting restriction, no repetitive bendign, and no prolonged sitting or standing. [name].  [name] follows up here with case manager, [name], and she was kindly seen by Dr. Sharolyn Douglas for a second opinion on 07/12/2016. She presents today complaining mainly of leg pain and intermittent minor back pain. She has reported that she has been reassigned at work and she has to go down the couple of ramps to get to the break room and that is a longer ambulation and aggravates her leg.  REVIEW OF SYSTEMS Review of systems is negative for fevers, chest pain, shortness of breath, unexplained recent weight loss, loss of bowel or bladder function, burning with urination, joint swelling, rashes, weakness or numbness, difficulty with balance, easy bruising, excessive thirst or frequent urination.  She reports that she has actually reduced her tobacco use, which she has persisted.  She did have an IME for which she saw Dr. Noel Gerold.  He reported that it is reasonable for decompression, he is concerned that she might have a relapse of that or require a fusion in the future and has suggested the possibility of a concomitant fusion.  Past Medical History:  Diagnosis Date  . Elevated LFTs   . History of kidney stones   . Hx of migraines   . Hyperlipidemia    hx of  . Menses, irregular   .  Pineal gland cyst   . Thyroid disease    hx abnormal thyroid levels    Past Surgical History:  Procedure Laterality Date  . KIDNEY STONE SURGERY  2009  . TUBAL LIGATION      Family History  Problem Relation Age of Onset  . Cancer Mother 68    breast cancer  . Diabetes Mother 32  . Hypertension Father    Social History:  reports that she has been smoking Cigarettes.  She has a 20.25 pack-year smoking history. She has never used smokeless tobacco. She reports that she does not drink alcohol or use drugs.  Allergies: No Known Allergies   (Not in a hospital admission)  No results found for this or any previous visit (from the past 48 hour(s)). No results found.  Review of Systems  Constitutional: Negative.   HENT: Negative.   Eyes: Negative.   Respiratory: Negative.   Cardiovascular: Negative.   Gastrointestinal: Negative.   Genitourinary: Negative.   Musculoskeletal: Positive for back pain.  Skin: Negative.   Neurological: Positive for sensory change and focal weakness.  Psychiatric/Behavioral: Negative.     There were no vitals taken for this visit. Physical Exam  Constitutional: She is oriented to person, place, and time. She appears well-developed and well-nourished.  HENT:  Head: Normocephalic.  Eyes: Pupils are equal, round, and reactive to light.  Neck: Normal range of motion.  Cardiovascular: Normal rate.   Respiratory: Effort normal.  GI: Soft.  Musculoskeletal:  On exam, healthy, in  moderate distress, walks with an antalgic gait. Mood and affect is appropriate. Tender left proximal gluteus. Straight leg raise, buttock, thigh and calf pain on the left, negative on the right.  Dr. Noel Geroldohen also reported an L5 radiculopathy today and suggested a possible EMG and nerve conduction study to rule out a neuropathy. Also, he would be more inclined to recommend a fusion due to dynamic foraminal stenosis.  Also, on her exam, she has diminished plantarflexion, trace  EHL weakness. She reports altered sensation in the S1 dermatome.  Neurological: She is alert and oriented to person, place, and time.  Skin: Skin is warm and dry.  Psychiatric: She has a normal mood and affect.    I reviewed her radiographs today. She has a thoracolumbar scoliosis of 14 degrees of T11 to L4.  Her MRI again demonstrates disc herniation at L5-S1.  Assessment/Plan 1. 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 2. 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 3. Thoracolumbar scoliosis. 4. Tobacco dependence. 5. Disc degeneration at 4-5.  I had extensive discussion with [name] concerning current pathology, relevant anatomy, and treatment option. I appreciate the thorough evaluation by Dr. Noel Geroldohen and his discussion with [name].  We had an extensive discussion concerning the pathology, again relevant anatomy and her treatment options.  I do feel that her radicular pain is secondary to her disc herniation at L5-S1. I do feel she has a component of an S1 radiculopathy, but I also feel she has a L5 radiculopathy as well given the secondary effect on the L5 nerve root with a scoliosis and a disc herniation tethering the S1 nerve root. I feel the effect on the L5 nerve root is mainly secondary to traction on the L5 nerve root as oppose to foraminal compression. I therefore feel that decompression of the S1 nerve root will allow normal excursion of the L5 nerve root. I do feel given the underlying thoracolumbar scoliosis, smoking and underlying disc degeneration that she is at risk for continued pathology in the future. Given that there is a possibility of her requiring a fusion in the future, my concern at this point is that she has minimal back pain per se and she has predominant leg pain and a focal disc herniation with dermatomal correlation, I feel a minimally invasive approach at this point would be my preference  to decompress the S1 nerve root and to allow excursion of the L5 nerve root. My concern at this point and augmenting that with a concomitant fusion would be her disc degeneration that is noted at L4-L5 and a fusion of L5-S1 would place an additional biomechanical strain at L4-5 and the concern about adjacent segment disease is one as well. It is difficult in these situations where the proposed procedures are not curative. In a sense, we are making more room for the nerve root as oppose to fixing the disc. I do feel her foramen is fairly capacious. I do not feel that she is having stenosis related to L4-L5. Typically, in these chronic stenotic pathologies, there is a concomitant epidural venous plexus that is tethering of the nerve roots and contributing to the radicular pain and again, certainly, she very well may require a fusion in the future; however, I would recommend that at the point that she declares that she has failed a minimally invasive approach. Certainly, though, after decompression, I would recommend to treat the injured disc and the underlying disc degeneration,  appropriate physical restrictions that would reduce her risk for recurrent disc herniation, namely avoiding unsupportive and repetitive bending, prolonged positioning as well as heavy bending and that could be established by appropriate physical therapy and assigning restrictions obtained in the functional capacity exam. Either way, I would feel her outcome would be improved commanding fully to tobacco cessation. After considerable discussion concerning this, she would like to proceed with microdecompression at L5-S1 only. We discussed the time overnight in the hospital and the previous postoperative course was documented as well. In terms of obtaining an EMG and nerve conduction study, she is not a diabetic, her pain is mainly dermatomal and although any additional information can be helpful in the decision making process, I feel it is  optional, not a prerequisite for the proposed procedure and certainly if she would like to proceed with that, that would be an option. Typically, I would utilize that in individuals who have persistent dermatomal neuropathic pain despite decompression and an adequate postoperative period of reinnervation. This is for evaluating for a battered root syndrome. At this point, it would be unlikely to alter the recommendation for decompression.  Again, we discussed with [name] at length the difficulty with treating lumbar disc disease in that again the procedure proposed is designed to take pressure off of the nerve root, make more room for the nerve root if you will to allow appropriate healing and non-compression to reduce her radicular pain that does not again fix the disc and she will still have a need to contend with disc degeneration at 5-1 and at 4-5. Again, handled with appropriate rehabilitation and work restrictions following the decompression. Again, my concern with the supplemental fusion at this point in the absence of instability is the adjacent segment issue, aggravating her disc degeneration at 4-5.  Again, I appreciate Dr. Wells Guiles evaluation and input. I discussed this in front of the patient with [name], case manager. Again, we will proceed as originally planned with the appropriate discussion and precautions.  Plan microlumbar decompression L5-S1 left  Dorothy Spark., PA-C for Dr. Shelle Adams 07/27/2016, 4:51 PM

## 2016-08-03 NOTE — Evaluation (Signed)
Physical Therapy Evaluation Patient Details Name: [name] MRN: [id number] DOB: 1970-12-02 Today's Date: 08/03/2016   History of Present Illness  Pt s/p L4-5 micro-lumbar decompression  Clinical Impression  Pt s/p back surgery and presenting with functional mobility limitations 2* post op pain and back precautions.  Pt should progress to dc home with family assist.    Follow Up Recommendations No PT follow up    Equipment Recommendations  Rolling walker with 5" wheels    Recommendations for Other Services       Precautions / Restrictions Precautions Precautions: Back Precaution Booklet Issued: Yes (comment) Precaution Comments: Back precautions reviewed x 2 Restrictions Weight Bearing Restrictions: No      Mobility  Bed Mobility Overal bed mobility: Needs Assistance Bed Mobility: Supine to Sit     Supine to sit: Min assist     General bed mobility comments: cues for correct log roll technique and adherence to back precautions  Transfers Overall transfer level: Needs assistance Equipment used: Rolling walker (2 wheeled) Transfers: Sit to/from Stand Sit to Stand: Min assist         General transfer comment: cues for transition position, adherence to back precautions and use of UEs to self assist  Ambulation/Gait Ambulation/Gait assistance: Min assist Ambulation Distance (Feet): 75 Feet (and 15' back from bathroom) Assistive device: Rolling walker (2 wheeled) Gait Pattern/deviations: Step-through pattern;Decreased step length - right;Decreased step length - left;Shuffle;Trunk flexed Gait velocity: decr Gait velocity interpretation: Below normal speed for age/gender General Gait Details: cues for posture and position from RW  Stairs            Wheelchair Mobility    Modified Rankin (Stroke Patients Only)       Balance                                             Pertinent Vitals/Pain Pain Assessment: 0-10 Pain Score:  4  Pain Location: back Pain Descriptors / Indicators: Aching;Sore Pain Intervention(s): Limited activity within patient's tolerance;Monitored during session;Premedicated before session    Home Living Family/patient expects to be discharged to:: Private residence Living Arrangements: Spouse/significant other Available Help at Discharge: Family;Available 24 hours/day Type of Home: House Home Access: Stairs to enter Entrance Stairs-Rails: None Entrance Stairs-Number of Steps: 3 Home Layout: One level Home Equipment: Cane - single point      Prior Function Level of Independence: Independent with assistive device(s);Needs assistance   Gait / Transfers Assistance Needed: Used cane as needed  ADL's / Homemaking Assistance Needed: Assist of family as needed        Hand Dominance        Extremity/Trunk Assessment   Upper Extremity Assessment Upper Extremity Assessment: Overall WFL for tasks assessed    Lower Extremity Assessment Lower Extremity Assessment: Overall WFL for tasks assessed       Communication   Communication: No difficulties  Cognition Arousal/Alertness: Awake/alert Behavior During Therapy: WFL for tasks assessed/performed Overall Cognitive Status: Within Functional Limits for tasks assessed                                        General Comments      Exercises     Assessment/Plan    PT Assessment Patient needs continued PT services  PT Problem  List Decreased activity tolerance;Decreased balance;Decreased mobility;Decreased knowledge of use of DME;Decreased knowledge of precautions;Pain       PT Treatment Interventions DME instruction;Gait training;Stair training;Functional mobility training;Therapeutic activities;Therapeutic exercise;Patient/family education    PT Goals (Current goals can be found in the Care Plan section)  Acute Rehab PT Goals Patient Stated Goal: Regain IND and move without pain PT Goal Formulation: With  patient Time For Goal Achievement: 08/06/16 Potential to Achieve Goals: Good    Frequency 7X/week   Barriers to discharge        Co-evaluation               End of Session   Activity Tolerance: Patient tolerated treatment well;Patient limited by fatigue Patient left: in chair;with call bell/phone within reach;with family/visitor present Nurse Communication: Mobility status PT Visit Diagnosis: Unsteadiness on feet (R26.81)    Time: 1610-9604 PT Time Calculation (min) (ACUTE ONLY): 25 min   Charges:   PT Evaluation $PT Eval Low Complexity: 1 Procedure PT Treatments $Gait Training: 8-22 mins   PT G Codes:   PT G-Codes **NOT FOR INPATIENT CLASS** Functional Assessment Tool Used: Clinical judgement Functional Limitation: Mobility: Walking and moving around Mobility: Walking and Moving Around Current Status (V4098): At least 20 percent but less than 40 percent impaired, limited or restricted Mobility: Walking and Moving Around Goal Status [phone]): At least 1 percent but less than 20 percent impaired, limited or restricted      [facility] 08/03/2016, 6:37 PM

## 2016-08-03 NOTE — Anesthesia Procedure Notes (Addendum)
Procedure Name: Intubation Date/Time: 08/03/2016 7:33 AM Performed by: Jarvis NewcomerARMISTEAD, Essie Gehret A Pre-anesthesia Checklist: Patient identified, Emergency Drugs available, Suction available, Patient being monitored and Timeout performed Patient Re-evaluated:Patient Re-evaluated prior to inductionOxygen Delivery Method: Circle system utilized Preoxygenation: Pre-oxygenation with 100% oxygen Intubation Type: IV induction Ventilation: Mask ventilation without difficulty Laryngoscope Size: Miller and 2 Grade View: Grade III Tube type: Oral Tube size: 7.5 mm Number of attempts: 1 Airway Equipment and Method: Stylet Placement Confirmation: positive ETCO2,  ETT inserted through vocal cords under direct vision and breath sounds checked- equal and bilateral Secured at: 22 (at lip) cm Tube secured with: Tape Dental Injury: Teeth and Oropharynx as per pre-operative assessment  Comments: Intubated by Jacobo Forestlaire Hoffmann, SRNA.

## 2016-08-03 NOTE — Discharge Instructions (Signed)
Walk As Tolerated utilizing back precautions.  No bending, twisting, or lifting.  No driving for 2 weeks.   °Aquacel dressing may remain in place until follow up. May shower with aquacel dressing in place. If the dressing peels off or becomes saturated, you may remove aquacel dressing and place gauze and tape dressing which should be kept clean and dry and changed daily. Do not remove steri-strips if they are present. °See Dr. Cartina Brousseau in office in 10 to 14 days. Begin taking aspirin 81mg per day starting 4 days after your surgery if not allergic to aspirin or on another blood thinner. °Walk daily even outside. Use a cane or walker only if necessary. °Avoid sitting on soft sofas. ° °

## 2016-08-03 NOTE — Anesthesia Preprocedure Evaluation (Addendum)
Anesthesia Evaluation  Patient identified by MRN, date of birth, ID band Patient awake    Reviewed: Allergy & Precautions, NPO status , Patient's Chart, lab work & pertinent test results  Airway Mallampati: II  TM Distance: >3 FB Neck ROM: Full    Dental no notable dental hx.    Pulmonary Current Smoker,    Pulmonary exam normal breath sounds clear to auscultation       Cardiovascular negative cardio ROS Normal cardiovascular exam Rhythm:Regular Rate:Normal     Neuro/Psych negative neurological ROS  negative psych ROS   GI/Hepatic negative GI ROS, Neg liver ROS,   Endo/Other  Hypothyroidism   Renal/GU negative Renal ROS  negative genitourinary   Musculoskeletal negative musculoskeletal ROS (+)   Abdominal   Peds negative pediatric ROS (+)  Hematology negative hematology ROS (+)   Anesthesia Other Findings   Reproductive/Obstetrics negative OB ROS                             Anesthesia Physical Anesthesia Plan  ASA: II  Anesthesia Plan: General   Post-op Pain Management:    Induction: Intravenous  Airway Management Planned: Oral ETT  Additional Equipment:   Intra-op Plan:   Post-operative Plan: Extubation in OR  Informed Consent: I have reviewed the patients History and Physical, chart, labs and discussed the procedure including the risks, benefits and alternatives for the proposed anesthesia with the patient or authorized representative who has indicated his/her understanding and acceptance.   Dental advisory given  Plan Discussed with: CRNA and Surgeon  Anesthesia Plan Comments:         Anesthesia Quick Evaluation  

## 2016-08-03 NOTE — Brief Op Note (Signed)
08/03/2016  8:54 AM  PATIENT:  Randye Loboebecca J Dyckman  46 y.o. female  PRE-OPERATIVE DIAGNOSIS:  HNP L5-S1 left  POST-OPERATIVE DIAGNOSIS:  hnp L5-S1 left  PROCEDURE:  Procedure(s): Microlumbar decompression L5-S1 left (Left)  SURGEON:  Surgeon(s) and Role:    * Jene EveryJeffrey Sarai January, MD - Primary  PHYSICIAN ASSISTANT:   ASSISTANTS: Bissell   ANESTHESIA:   general  EBL:  No intake/output data recorded.  BLOOD ADMINISTERED:none  DRAINS: none   LOCAL MEDICATIONS USED:  MARCAINE     SPECIMEN:  Source of Specimen:  L5S1  DISPOSITION OF SPECIMEN:  PATHOLOGY  COUNTS:  YES  TOURNIQUET:  * No tourniquets in log *  DICTATION: .Other Dictation: Dictation Number [id number]  PLAN OF CARE: Admit for overnight observation  PATIENT DISPOSITION:  PACU - hemodynamically stable.   Delay start of Pharmacological VTE agent (>24hrs) due to surgical blood loss or risk of bleeding: yes

## 2016-08-04 ENCOUNTER — Encounter (HOSPITAL_COMMUNITY): Payer: Self-pay | Admitting: Specialist

## 2016-08-04 DIAGNOSIS — M5127 Other intervertebral disc displacement, lumbosacral region: Secondary | ICD-10-CM | POA: Diagnosis not present

## 2016-08-04 MED ORDER — MELOXICAM 7.5 MG PO TABS: 7.5000 mg | ORAL_TABLET | Freq: Two times a day (BID) | ORAL | 0 refills | Status: AC

## 2016-08-04 MED ORDER — ASPIRIN 81 MG PO CHEW: 81.0000 mg | CHEWABLE_TABLET | Freq: Every day | ORAL | Status: AC

## 2016-08-04 NOTE — Progress Notes (Addendum)
Subjective: 1 Day Post-Op Procedure(s) (LRB): Microlumbar decompression L5-S1 left (Left) Patient reports pain as moderate.  Reports incisional back pain. L great toe feels numb along medial aspect (same as before surgery, no worse). Voiding without difficulty. No BM yet + flatus. Does feel ready to go home today. No other c/o.  Objective: Vital signs in last 24 hours: Temp:  [97.5 F (36.4 C)-98.7 F (37.1 C)] 98.7 F (37.1 C) (03/29 0516) Pulse Rate:  [80-102] 94 (03/29 0516) Resp:  [10-31] 16 (03/29 0516) BP: (96-112)/(60-90) 96/63 (03/29 0516) SpO2:  [93 %-100 %] 99 % (03/29 0516) Weight:  [71.2 kg (157 lb)] 71.2 kg (157 lb) (03/28 1040)  Intake/Output from previous day: 03/28 0701 - 03/29 0700 In: 3697.5 [P.O.:1680; I.V.:1762.5; IV Piggyback:255] Out: 2625 [Urine:2600; Blood:25] Intake/Output this shift: No intake/output data recorded.   Recent Labs  08/01/16 0928  HGB 15.0    Recent Labs  08/01/16 0928  WBC 10.8*  RBC 5.14*  HCT 44.5  PLT 330    Recent Labs  08/01/16 0928  NA 137  K 3.8  CL 107  CO2 22  BUN 28*  CREATININE 0.71  GLUCOSE 94  CALCIUM 9.2   No results for input(s): LABPT, INR in the last 72 hours.  Neurologically intact ABD soft Neurovascular intact Sensation intact distally Intact pulses distally Dorsiflexion/Plantar flexion intact Incision: dressing C/D/I and no drainage No cellulitis present Compartment soft no sign of DVT  Trace EHL weakness left (present pre-op, perhaps slightly improved this AM)  Assessment/Plan: 1 Day Post-Op Procedure(s) (LRB): Microlumbar decompression L5-S1 left (Left) Advance diet Up with therapy D/C IV fluids  Expect numbness and weakness to improve with time post-op Discussed D/C instructions, dressing instructions, Lspine precautions D/C home today Follow up 2 weeks post-op outpt for suture removal  BISSELL, JACLYN M. 08/04/2016, 7:49 AM

## 2016-08-04 NOTE — Discharge Summary (Signed)
Physician Discharge Summary   Patient ID: [name] MRN: [id number] DOB/AGE: 47-Feb-1972 47 y.o.  Admit date: 08/03/2016 Discharge date: 08/04/2016  Primary Diagnosis:   HNP L5-S1 left  Admission Diagnoses:  Past Medical History:  Diagnosis Date  . Anxiety   . Complication of anesthesia    slow to wake up  . Elevated LFTs   . Family history of adverse reaction to anesthesia    mom slow to wake up  . 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 History of kidney stones   . Hx of migraines   . Hyperlipidemia    hx of  . Menses, irregular   . Pineal gland cyst   . Thyroid disease    hx abnormal thyroid levels   Discharge Diagnoses:   Principal Problem:   HNP (herniated nucleus pulposus), lumbar  Procedure:  Procedure(s) (LRB): Microlumbar decompression L5-S1 left (Left)   Consults: None  HPI:  see H&P    Laboratory Data: Hospital Outpatient Visit on 08/01/2016  Component Date Value Ref Range Status  . Sodium 08/01/2016 137  135 - 145 mmol/L Final  . Potassium 08/01/2016 3.8  3.5 - 5.1 mmol/L Final  . Chloride 08/01/2016 107  101 - 111 mmol/L Final  . CO2 08/01/2016 22  22 - 32 mmol/L Final  . Glucose, Bld 08/01/2016 94  65 - 99 mg/dL Final  . BUN 08/01/2016 28* 6 - 20 mg/dL Final  . Creatinine, Ser 08/01/2016 0.71  0.44 - 1.00 mg/dL Final  . Calcium 08/01/2016 9.2  8.9 - 10.3 mg/dL Final  . GFR calc non Af Amer 08/01/2016 >60  >60 mL/min Final  . GFR calc Af Amer 08/01/2016 >60  >60 mL/min Final   Comment: (NOTE) The eGFR has been calculated using the CKD EPI equation. This calculation has not been validated in all clinical situations. eGFR's persistently <60 mL/min signify possible Chronic Kidney Disease.   . Anion gap 08/01/2016 8  5 - 15 Final  . WBC 08/01/2016 10.8* 4.0 - 10.5 K/uL Final  . RBC 08/01/2016 5.14* 3.87 - 5.11 MIL/uL Final  . Hemoglobin 08/01/2016 15.0  12.0 - 15.0 g/dL Final  . HCT 08/01/2016 44.5  36.0 - 46.0 % Final  . MCV  08/01/2016 86.6  78.0 - 100.0 fL Final  . MCH 08/01/2016 29.2  26.0 - 34.0 pg Final  . MCHC 08/01/2016 33.7  30.0 - 36.0 g/dL Final  . RDW 08/01/2016 13.1  11.5 - 15.5 % Final  . Platelets 08/01/2016 330  150 - 400 K/uL Final  . MRSA, PCR 08/01/2016 NEGATIVE  NEGATIVE Final  . Staphylococcus aureus 08/01/2016 NEGATIVE  NEGATIVE Final   Comment:        The Xpert SA Assay (FDA approved for NASAL specimens in patients over 38 years of age), is one component of a comprehensive surveillance program.  Test performance has been validated by [facility] for patients greater than or equal to 71 year old. It is not intended to diagnose infection nor to guide or monitor treatment.     Recent Labs  08/01/16 0928  HGB 15.0    Recent Labs  08/01/16 0928  WBC 10.8*  RBC 5.14*  HCT 44.5  PLT 330    Recent Labs  08/01/16 0928  NA 137  K 3.8  CL 107  CO2 22  BUN 28*  CREATININE 0.71  GLUCOSE 94  CALCIUM 9.2   No results for input(s): LABPT, INR in the last 72  hours.  X-Rays:Dg Lumbar Spine 2-3 Views  Result Date: 08/01/2016 CLINICAL DATA:  Lumbar surgery. EXAM: LUMBAR SPINE - 2-3 VIEW COMPARISON:  02/18/2016. FINDINGS: Scoliosis lumbar spine concave right. Mild degenerative change. No acute or focal bony abnormality. Soft tissue structures are unremarkable. Exam stable from prior exam. IMPRESSION: Scoliosis lumbar spine concave right. Mild degenerative change. No acute or focal abnormality identified. Electronically Signed   By: Marcello Moores  Register   On: 08/01/2016 09:47   Dg Spine Portable 1 View  Result Date: 08/03/2016 CLINICAL DATA:  Surgery,.  Intraoperative localization. EXAM: PORTABLE SPINE - 1 VIEW COMPARISON:  To films from the same day. FINDINGS: A surgical probe is directed at the L5-S1 interspace. Inferior endplate of L5. IMPRESSION: Intraoperative localization of the L5-S1 disc space. Electronically Signed   By: San Morelle M.D.   On: 08/03/2016 08:38   Dg  Spine Portable 1 View  Result Date: 08/03/2016 CLINICAL DATA:  Intraoperative localization at L5-S1 EXAM: PORTABLE SPINE - 1 VIEW COMPARISON:  Film from earlier in the same day FINDINGS: Surgical retractors and instruments are noted at the L5-S1 level. The numbering nomenclature is similar to that used on prior exams. IMPRESSION: Intraoperative localization at L5-S1 Electronically Signed   By: Inez Catalina M.D.   On: 08/03/2016 08:17   Dg Spine Portable 1 View  Result Date: 08/03/2016 CLINICAL DATA:  L5-S1 laminectomy EXAM: PORTABLE SPINE - 1 VIEW COMPARISON:  Lumbar spine radiographs August 01, 2016 FINDINGS: Cross-table lateral lumbar image labeled #1 submitted. Metallic probe tips are posterior to the superior aspect of the L5 vertebral body and posterior to the L5-S1 interspace respectively. No fracture or spondylolisthesis. IMPRESSION: Metallic probe tips are posterior to the superior aspect of the L5 vertebral body and posterior to the L5-S1 interspace level respectively. No fracture or spondylolisthesis. Electronically Signed   By: Lowella Grip III M.D.   On: 08/03/2016 07:59    EKG:No orders found for this or any previous visit.   Hospital Course: Patient was admitted to [facility] and taken to the OR and underwent the above state procedure without complications.  Patient tolerated the procedure well and was later transferred to the recovery room and then to the orthopaedic floor for postoperative care.  They were given PO and IV analgesics for pain control following their surgery.  They were given 24 hours of postoperative antibiotics.   PT was consulted postop to assist with mobility and transfers.  The patient was allowed to be WBAT with therapy and was taught back precautions. Discharge planning was consulted to help with postop disposition and equipment needs.  Patient had a good night on the evening of surgery and started to get up OOB with therapy on day one. Patient was seen  in rounds and was ready to go home on day one.  They were given discharge instructions and dressing directions.  They were instructed on when to follow up in the office with Dr. Tonita Cong.   Diet: Regular diet Activity:WBAT; Lspine precautions Follow-up:in 10-14 days Disposition - Home Discharged Condition: good   Discharge Instructions    Call MD / Call 911    Complete by:  As directed    If you experience chest pain or shortness of breath, CALL 911 and be transported to the hospital emergency room.  If you develope a fever above 101 F, pus (white drainage) or increased drainage or redness at the wound, or calf pain, call your surgeon's office.   Constipation Prevention  Complete by:  As directed    Drink plenty of fluids.  Prune juice may be helpful.  You may use a stool softener, such as Colace (over the counter) 100 mg twice a day.  Use MiraLax (over the counter) for constipation as needed.   Diet - low sodium heart healthy    Complete by:  As directed    Increase activity slowly as tolerated    Complete by:  As directed      Allergies as of 08/04/2016   No Known Allergies     Medication List    TAKE these medications   aspirin 81 MG chewable tablet Chew 1 tablet (81 mg total) by mouth daily. May resume 4 days post-op What changed:  how much to take  additional instructions   docusate sodium 100 MG capsule Commonly known as:  COLACE Take 1 capsule (100 mg total) by mouth 2 (two) times daily as needed for mild constipation.   gabapentin 300 MG capsule Commonly known as:  NEURONTIN Take 1 capsule by mouth 3 (three) times daily.   meloxicam 7.5 MG tablet Commonly known as:  MOBIC Take 1 tablet (7.5 mg total) by mouth 2 (two) times daily. May resume 5 days post-op What changed:  additional instructions   methocarbamol 500 MG tablet Commonly known as:  ROBAXIN Take 1 tablet (500 mg total) by mouth every 6 (six) hours as needed for muscle spasms.     oxyCODONE-acetaminophen 5-325 MG tablet Commonly known as:  PERCOCET Take 1-2 tablets by mouth every 4 (four) hours as needed for severe pain.   polyethylene glycol packet Commonly known as:  MIRALAX / GLYCOLAX Take 17 g by mouth daily.      Follow-up Information    BEANE,JEFFREY C, MD Follow up in 2 week(s).   Specialty:  Orthopedic Surgery Contact information: [address] [place] [address] [phone]           Signed: Lacie Draft, PA-C Orthopaedic Surgery 08/04/2016, 7:52 AM

## 2016-08-04 NOTE — Progress Notes (Signed)
10:30:  Spoke to [name] @ [facility] via  Phone @ [phone] to make him aware of DME needs and to let him [facility]. He requested the orders for  DME be faxed to him @ [phone] and he will advise with return phone call.

## 2016-08-04 NOTE — Evaluation (Signed)
Occupational Therapy Evaluation Patient Details Name: Debra LoboRebecca J Adams MRN: [id number] DOB: 27-Apr-1971 Today's Date: 08/04/2016    History of Present Illness s/p L5-S1 decompression   Clinical Impression   This 46 year old female was admitted for the above sx. All education was completed. No further OT is needed at this time    Follow Up Recommendations  No OT follow up    Equipment Recommendations  3 in 1 bedside commode    Recommendations for Other Services       Precautions / Restrictions Precautions Precautions: Back Precaution Comments: Back precautions reviewed  Restrictions Weight Bearing Restrictions: No Other Position/Activity Restrictions: WBAT      Mobility Bed Mobility         Supine to sit: Min assist     General bed mobility comments: to ensure back precautions/log roll  Transfers   Equipment used: Rolling walker (2 wheeled)   Sit to Stand: Min guard         General transfer comment: cues for UE placement    Balance                                           ADL either performed or assessed with clinical judgement   ADL Overall ADL's : Needs assistance/impaired Eating/Feeding: Independent   Grooming: Set up;Sitting   Upper Body Bathing: Set up;Sitting   Lower Body Bathing: Moderate assistance;Sit to/from stand   Upper Body Dressing : Set up;Sitting   Lower Body Dressing: Maximal assistance;Sit to/from stand   Toilet Transfer: Min guard;Ambulation;RW Toilet Transfer Details (indicate cue type and reason): back to bed Toileting- Clothing Manipulation and Hygiene: Minimal assistance;Sit to/from stand         General ADL Comments: educated on back precautions and ADLs. Educated on scrubbing bubbles wand as toilet aide as well as traditional toilet aide as options.  Demonstrated using RW to step into shower stall     Vision         Perception     Praxis      Pertinent Vitals/Pain Pain Assessment:  Faces Faces Pain Scale: Hurts little more Pain Location: back Pain Descriptors / Indicators: Aching;Sore Pain Intervention(s): Limited activity within patient's tolerance;Monitored during session;Premedicated before session;Repositioned     Hand Dominance     Extremity/Trunk Assessment Upper Extremity Assessment Upper Extremity Assessment: Overall WFL for tasks assessed           Communication Communication Communication: No difficulties   Cognition Arousal/Alertness: Awake/alert Behavior During Therapy: WFL for tasks assessed/performed Overall Cognitive Status: Within Functional Limits for tasks assessed                                     General Comments       Exercises     Shoulder Instructions      Home Living Family/patient expects to be discharged to:: Private residence Living Arrangements: Spouse/significant other Available Help at Discharge: Family;Available 24 hours/day               Bathroom Shower/Tub: Producer, television/film/videoWalk-in shower   Bathroom Toilet: Standard                Prior Functioning/Environment Level of Independence: Independent with assistive device(s);Needs assistance  Gait / Transfers Assistance Needed: Used cane as needed ADL's / Homemaking Assistance  Needed: Assist of family as needed            OT Problem List:        OT Treatment/Interventions:      OT Goals(Current goals can be found in the care plan section) Acute Rehab OT Goals Patient Stated Goal: Regain IND and move without pain OT Goal Formulation: All assessment and education complete, DC therapy  OT Frequency:     Barriers to D/C:            Co-evaluation              End of Session    Activity Tolerance: Patient tolerated treatment well Patient left: in bed;with call bell/phone within reach  OT Visit Diagnosis: Muscle weakness (generalized) (M62.81)                Time: 1610-9604 OT Time Calculation (min): 22 min Charges:  OT General  Charges $OT Visit: 1 Procedure OT Evaluation $OT Eval Low Complexity: 1 Procedure G-Codes: OT G-codes **NOT FOR INPATIENT CLASS** Functional Assessment Tool Used: AM-PAC 6 Clicks Daily Activity;Clinical judgement Functional Limitation: Self care Self Care Current Status (V4098): At least 40 percent but less than 60 percent impaired, limited or restricted Self Care Goal Status (J1914): At least 40 percent but less than 60 percent impaired, limited or restricted Self Care Discharge Status [phone]): At least 40 percent but less than 60 percent impaired, limited or restricted   [name], OTR/L [phone] 08/04/2016  [name] 08/04/2016, 9:37 AM

## 2016-08-04 NOTE — Progress Notes (Signed)
12:08: Spoke with [employer] CM who has confirmed receipt of DME orders and will deliver to pts room as soon as able . Ias en route at present. No further CM needs at this time.

## 2016-08-04 NOTE — Progress Notes (Addendum)
qPhysical Therapy Treatment Patient Details Name: [name] MRN: [id number] DOB: Feb 08, 1971 Today's Date: 08/04/2016    History of Present Illness s/p L5-S1 decompression    PT Comments    Assisted OOB to amb in hallway, practice stairs then back to bed.  Pt aware of her back percautions.  Performed good "log rolling" tech to get OOB and back into bed.  Amb well with the RW.  Instructed on use of ICE.   Follow Up Recommendations  No PT follow up     Equipment Recommendations  Rolling walker with 5" wheels    Recommendations for Other Services       Precautions / Restrictions Precautions Precautions: Back Precaution Comments: Back precautions reviewed  Restrictions Weight Bearing Restrictions: No Other Position/Activity Restrictions: WBAT    Mobility  Bed Mobility Overal bed mobility: Needs Assistance Bed Mobility: Rolling;Sidelying to Sit;Sit to Sidelying Rolling: Supervision;Min guard Sidelying to sit: Supervision;Min guard Supine to sit: Min assist   Sit to sidelying: Supervision;Min guard General bed mobility comments: increased time and good "log roll" tech   Transfers Overall transfer level: Needs assistance Equipment used: None Transfers: Sit to/from [employer] Sit to Stand: Supervision Stand pivot transfers: Supervision       General transfer comment: increased time with good use of B LE and core to control  Ambulation/Gait Ambulation/Gait assistance: Supervision;Min guard Ambulation Distance (Feet): 120 Feet Assistive device:RW Gait Pattern/deviations: Step-to pattern;Step-through pattern     General Gait Details: increased time with decrease step length.    Stairs Stairs: Yes   Stair Management: Two rails;Step to pattern;Forwards Number of Stairs: 2 General stair comments: with spouse performed well  Wheelchair Mobility    Modified Rankin (Stroke Patients Only)       Balance                                             Cognition Arousal/Alertness: Awake/alert Behavior During Therapy: WFL for tasks assessed/performed Overall Cognitive Status: Within Functional Limits for tasks assessed                                        Exercises      General Comments        Pertinent Vitals/Pain Pain Assessment: 0-10 Pain Score: 4  Faces Pain Scale: Hurts little more Pain Location: back Pain Descriptors / Indicators: Aching;Sore;Operative site guarding Pain Intervention(s): Monitored during session;Repositioned;Ice applied    Home Living Family/patient expects to be discharged to:: Private residence Living Arrangements: Spouse/significant other Available Help at Discharge: Family;Available 24 hours/day                Prior Function Level of Independence: Independent with assistive device(s);Needs assistance  Gait / Transfers Assistance Needed: Used cane as needed ADL's / Homemaking Assistance Needed: Assist of family as needed     PT Goals (current goals can now be found in the care plan section) Acute Rehab PT Goals Patient Stated Goal: Regain IND and move without pain Progress towards PT goals: Progressing toward goals    Frequency    7X/week      PT Plan Current plan remains appropriate    Co-evaluation             End of Session Equipment Utilized During Treatment:  Gait belt Activity Tolerance: Patient tolerated treatment well Patient left: in bed;with call bell/phone within reach;with family/visitor present Nurse Communication: Mobility status (pt ready for D/C to home) PT Visit Diagnosis: Unsteadiness on feet (R26.81)     Time: [id number] PT Time Calculation (min) (ACUTE ONLY): 25 min  Charges:  $Gait Training: 8-22 mins $Therapeutic Activity: 8-22 mins                    G Codes:       Felecia ShellingLori Darryel Diodato  PTA [facility]  Acute  Rehab Pager      [phone]

## 2016-08-04 NOTE — Care Management Note (Signed)
Case Management Note  Patient Details  Name: [name] MRN: [id number] Date of Birth: 07-28-1970  Subjective/Objective: 46 y.o. F admitted 08/03/2016 for Microdiscectomy. Pt tells me she has Cane at home and will need RW and 3n1. CM LM for Workers Comp CM Vladimir CreeksDavid Cook @ [phone] to advise of the DME agency to be utilized for these needs. Anticipate discharge at noon today.                    Action/Plan: CM will continue to follow   Expected Discharge Date:  08/04/16               Expected Discharge Plan:  Home/Self Care  In-House Referral:  NA  Discharge planning Services  CM Consult  Post Acute Care Choice:  Durable Medical Equipment Choice offered to:  Patient  DME Arranged:  3-N-1, Walker rolling DME Agency:     HH Arranged:    HH Agency:     Status of Service:  In process, will continue to follow  If discussed at Long Length of Stay Meetings, dates discussed:    Additional Comments:  Yvone NeuCrutchfield, Forest Pruden M, RN 08/04/2016, 8:15 AM

## 2016-08-04 NOTE — Care Management Note (Signed)
Case Management Note  Patient Details  Name: Debra LoboRebecca J Adams MRN: [id number] Date of Birth: December 31, 1970  Subjective/Objective: Received multiple calls that pt was ready to be discharged and DME had not been delivered by approved agency by 12:00. Director of 6E has authorized RW to be given to pt so that she can be discharged home and not have to wait any longer.                     Action/Plan: [facility]    Expected Discharge Date:  08/04/16               Expected Discharge Plan:  Home/Self Care  In-House Referral:  NA  Discharge planning Services  CM Consult  Post Acute Care Choice:  Durable Medical Equipment Choice offered to:  Patient  DME Arranged:  3-N-1, Walker rolling DME Agency:   ([facility] CM)  HH Arranged:    HH Agency:     Status of Service:  In process, will continue to follow  If discussed at Long Length of Stay Meetings, dates discussed:    Additional Comments:  Yvone NeuCrutchfield, Marvin Maenza M, RN 08/04/2016, 3:13 PM

## 2016-08-04 NOTE — Progress Notes (Addendum)
RN reviewed discharge instructions with patient and family. All questions answered.   Paperwork and prescriptions given.   NT rolled patient down with all belongings to family car.    Patient waiting on equipment from [employer], per CM. Patient has been waiting since 10 am for walker. Walker not delivered by 1500. Rn gave patient a walker from [facility].

## 2016-08-11 ENCOUNTER — Encounter: Payer: Self-pay | Admitting: Physician Assistant

## 2016-08-24 ENCOUNTER — Other Ambulatory Visit: Payer: Self-pay | Admitting: Family Medicine

## 2016-08-24 ENCOUNTER — Other Ambulatory Visit: Payer: 59

## 2016-08-24 ENCOUNTER — Telehealth: Payer: Self-pay | Admitting: Family Medicine

## 2016-08-24 DIAGNOSIS — R509 Fever, unspecified: Secondary | ICD-10-CM

## 2016-08-24 LAB — INFLUENZA A AND B AG, IMMUNOASSAY
INFLUENZA A ANTIGEN: NOT DETECTED
INFLUENZA B ANTIGEN: NOT DETECTED

## 2016-08-24 NOTE — Telephone Encounter (Signed)
The last I heard, we were out of Flu tests--Do we now have Flu tests ???

## 2016-08-24 NOTE — Telephone Encounter (Signed)
Pt at Orth[facility] today for post op back surgery visits.  Is sick with general malaise and temp 101.7.  He is doing blood work and a urine culture.  Wants to send her here for a Flu test.  Told fine to send pt here for rapid flu.

## 2016-08-24 NOTE — Telephone Encounter (Signed)
Go ahead and start Tamiflu  BID for 5 days, it will take a day or two before flu test comes back She can take fever reducer  We have had some Influenza B still lingering

## 2016-08-25 NOTE — Telephone Encounter (Signed)
We do have a new stock of rapid Flu.  Her Flu test was negative.  Tamiflu not called in.  Dr Ermelinda Das office made aware of results.  He did blood work and urine culture at his office.Marland Kitchen

## 2016-08-26 ENCOUNTER — Ambulatory Visit (INDEPENDENT_AMBULATORY_CARE_PROVIDER_SITE_OTHER): Payer: 59 | Admitting: Family Medicine

## 2016-08-26 ENCOUNTER — Encounter: Payer: Self-pay | Admitting: Family Medicine

## 2016-08-26 VITALS — BP 104/60 | HR 90 | Temp 98.4°F | Resp 18 | Ht 63.5 in | Wt 160.0 lb

## 2016-08-26 DIAGNOSIS — R51 Headache: Secondary | ICD-10-CM | POA: Diagnosis not present

## 2016-08-26 DIAGNOSIS — N390 Urinary tract infection, site not specified: Secondary | ICD-10-CM | POA: Diagnosis not present

## 2016-08-26 DIAGNOSIS — R319 Hematuria, unspecified: Secondary | ICD-10-CM | POA: Diagnosis not present

## 2016-08-26 DIAGNOSIS — B349 Viral infection, unspecified: Secondary | ICD-10-CM

## 2016-08-26 DIAGNOSIS — R519 Headache, unspecified: Secondary | ICD-10-CM

## 2016-08-26 LAB — COMPREHENSIVE METABOLIC PANEL
ALT: 21 U/L (ref 6–29)
AST: 15 U/L (ref 10–35)
Albumin: 4.1 g/dL (ref 3.6–5.1)
Alkaline Phosphatase: 90 U/L (ref 33–115)
BILIRUBIN TOTAL: 0.8 mg/dL (ref 0.2–1.2)
BUN: 17 mg/dL (ref 7–25)
CALCIUM: 9.5 mg/dL (ref 8.6–10.2)
CO2: 22 mmol/L (ref 20–31)
Chloride: 102 mmol/L (ref 98–110)
Creat: 0.88 mg/dL (ref 0.50–1.10)
Glucose, Bld: 104 mg/dL — ABNORMAL HIGH (ref 70–99)
Potassium: 4 mmol/L (ref 3.5–5.3)
Sodium: 136 mmol/L (ref 135–146)
Total Protein: 6.8 g/dL (ref 6.1–8.1)

## 2016-08-26 LAB — URINALYSIS, MICROSCOPIC ONLY
CASTS: NONE SEEN [LPF]
Crystals: NONE SEEN [HPF]
YEAST: NONE SEEN [HPF]

## 2016-08-26 LAB — URINALYSIS, ROUTINE W REFLEX MICROSCOPIC
Bilirubin Urine: NEGATIVE
Glucose, UA: NEGATIVE
Ketones, ur: NEGATIVE
Nitrite: NEGATIVE
PH: 6 (ref 5.0–8.0)
Specific Gravity, Urine: 1.015 (ref 1.001–1.035)

## 2016-08-26 LAB — CBC
HEMATOCRIT: 42 % (ref 35.0–45.0)
Hemoglobin: 14.4 g/dL (ref 12.0–15.0)
MCH: 30.4 pg (ref 27.0–33.0)
MCHC: 34.3 g/dL (ref 32.0–36.0)
MCV: 88.8 fL (ref 80.0–100.0)
PLATELETS: 354 10*3/uL (ref 140–400)
RBC: 4.73 MIL/uL (ref 3.80–5.10)
RDW: 13.7 % (ref 11.0–15.0)
WBC: 12.5 10*3/uL — ABNORMAL HIGH (ref 3.8–10.8)

## 2016-08-26 MED ORDER — CEPHALEXIN 500 MG PO CAPS: 500.0000 mg | ORAL_CAPSULE | Freq: Four times a day (QID) | ORAL | 0 refills | Status: AC

## 2016-08-26 MED ORDER — CEFTRIAXONE SODIUM 1 G IJ SOLR
1.0000 g | Freq: Once | INTRAMUSCULAR | Status: AC
  Administered 2016-08-26: 1 g via INTRAMUSCULAR

## 2016-08-26 NOTE — Progress Notes (Signed)
   Subjective:    Patient ID: [name], female    DOB: 1971/03/22, 46 y.o.   MRN: [id number]  HPI    4 days ago, started with fever   Tmax 2 days ago , 103.51F  A few days ago. Body aches, headache, no cough congestion, no diarrhea, no vomiting. THis AM both shoulders felt stiff. Was seen by Dr. Shelle Iron  NO UTI symptoms noted, has culture pending at Dr. Shelle Iron  Was told WBC 17,000.  No rash   Had microlumbar decompression on 3/28 was doing okay until this week     Review of Systems  Constitutional: Positive for activity change, appetite change, chills, fatigue and fever.  HENT: Negative for congestion.   Eyes: Negative.   Respiratory: Negative for cough.   Cardiovascular: Negative.   Gastrointestinal: Negative.   Musculoskeletal: Positive for myalgias. Negative for neck pain.  Skin: Negative for rash.       Objective:   Physical Exam  Constitutional: She is oriented to person, place, and time. She appears well-developed and well-nourished.  Sick appearing, wearing glasses due to light  HENT:  Head: Normocephalic.  Right Ear: External ear normal.  Left Ear: External ear normal.  Nose: Nose normal.  Mouth/Throat: Oropharynx is clear and moist. No oropharyngeal exudate.  Eyes: Conjunctivae and EOM are normal. Pupils are equal, round, and reactive to light. Right eye exhibits no discharge. Left eye exhibits no discharge.  No papilledema  Neck: Normal range of motion. Neck supple.  Cardiovascular: Normal rate and regular rhythm.   No murmur heard. Pulmonary/Chest: Effort normal. No respiratory distress.  Abdominal: Soft. Bowel sounds are normal. She exhibits no distension.  Musculoskeletal: Normal range of motion.  Neurological: She is alert and oriented to person, place, and time. She has normal reflexes. No cranial nerve deficit.  FROM neck, neg Kernigs/Brudzinski  Skin: No rash noted. She is not diaphoretic.  Nursing note and vitals reviewed.     Flu neg       Assessment & Plan:   Viral Illness  UTI- repeat WBC in office 12.5 She is an improvement from her 17,000. She is afebrile. She her exam is fairly benign. Stat metabolic panel as well which was normal. She is significant white blood cells in her urine however. We'll treat this as urinary tract infection she had back surgery likely had a catheter in a few weeks ago. I've given her Rocephin 1 g here in the office and we'll send her home with cephalexin for the next week. I do not see any overt signs of any meningitis even though she has the headache though she has just been sick with myalgias in general. They did she also has underlying viral illness to begin with however flu test was negative and she's had symptoms for 4 days already.

## 2016-08-26 NOTE — Patient Instructions (Signed)
Rocephin injections given Start oral antibiotics this evening  Use your nausea medication  F/U if not improved or go to ER

## 2016-08-28 LAB — URINE CULTURE

## 2016-09-07 ENCOUNTER — Encounter: Payer: Self-pay | Admitting: Physician Assistant

## 2016-09-16 DIAGNOSIS — Z0100 Encounter for examination of eyes and vision without abnormal findings: Secondary | ICD-10-CM | POA: Diagnosis not present

## 2016-10-10 NOTE — Addendum Note (Signed)
Addendum  created 10/10/16 1246 by Breelyn Icard, MD   Sign clinical note    

## 2016-11-10 ENCOUNTER — Encounter: Payer: Self-pay | Admitting: Physician Assistant

## 2016-12-15 ENCOUNTER — Encounter: Payer: Self-pay | Admitting: Physician Assistant

## 2017-01-24 ENCOUNTER — Encounter: Payer: Self-pay | Admitting: Physician Assistant

## 2017-09-29 DIAGNOSIS — Z0100 Encounter for examination of eyes and vision without abnormal findings: Secondary | ICD-10-CM | POA: Diagnosis not present

## 2018-04-22 IMAGING — CR DG CHEST 1V
1 series · 1 of 1 positions shown · non-contrast
Comparison: None in PACs

CLINICAL DATA: Pre-employment screening

EXAM:
CHEST 1 VIEW

[w chest pa]
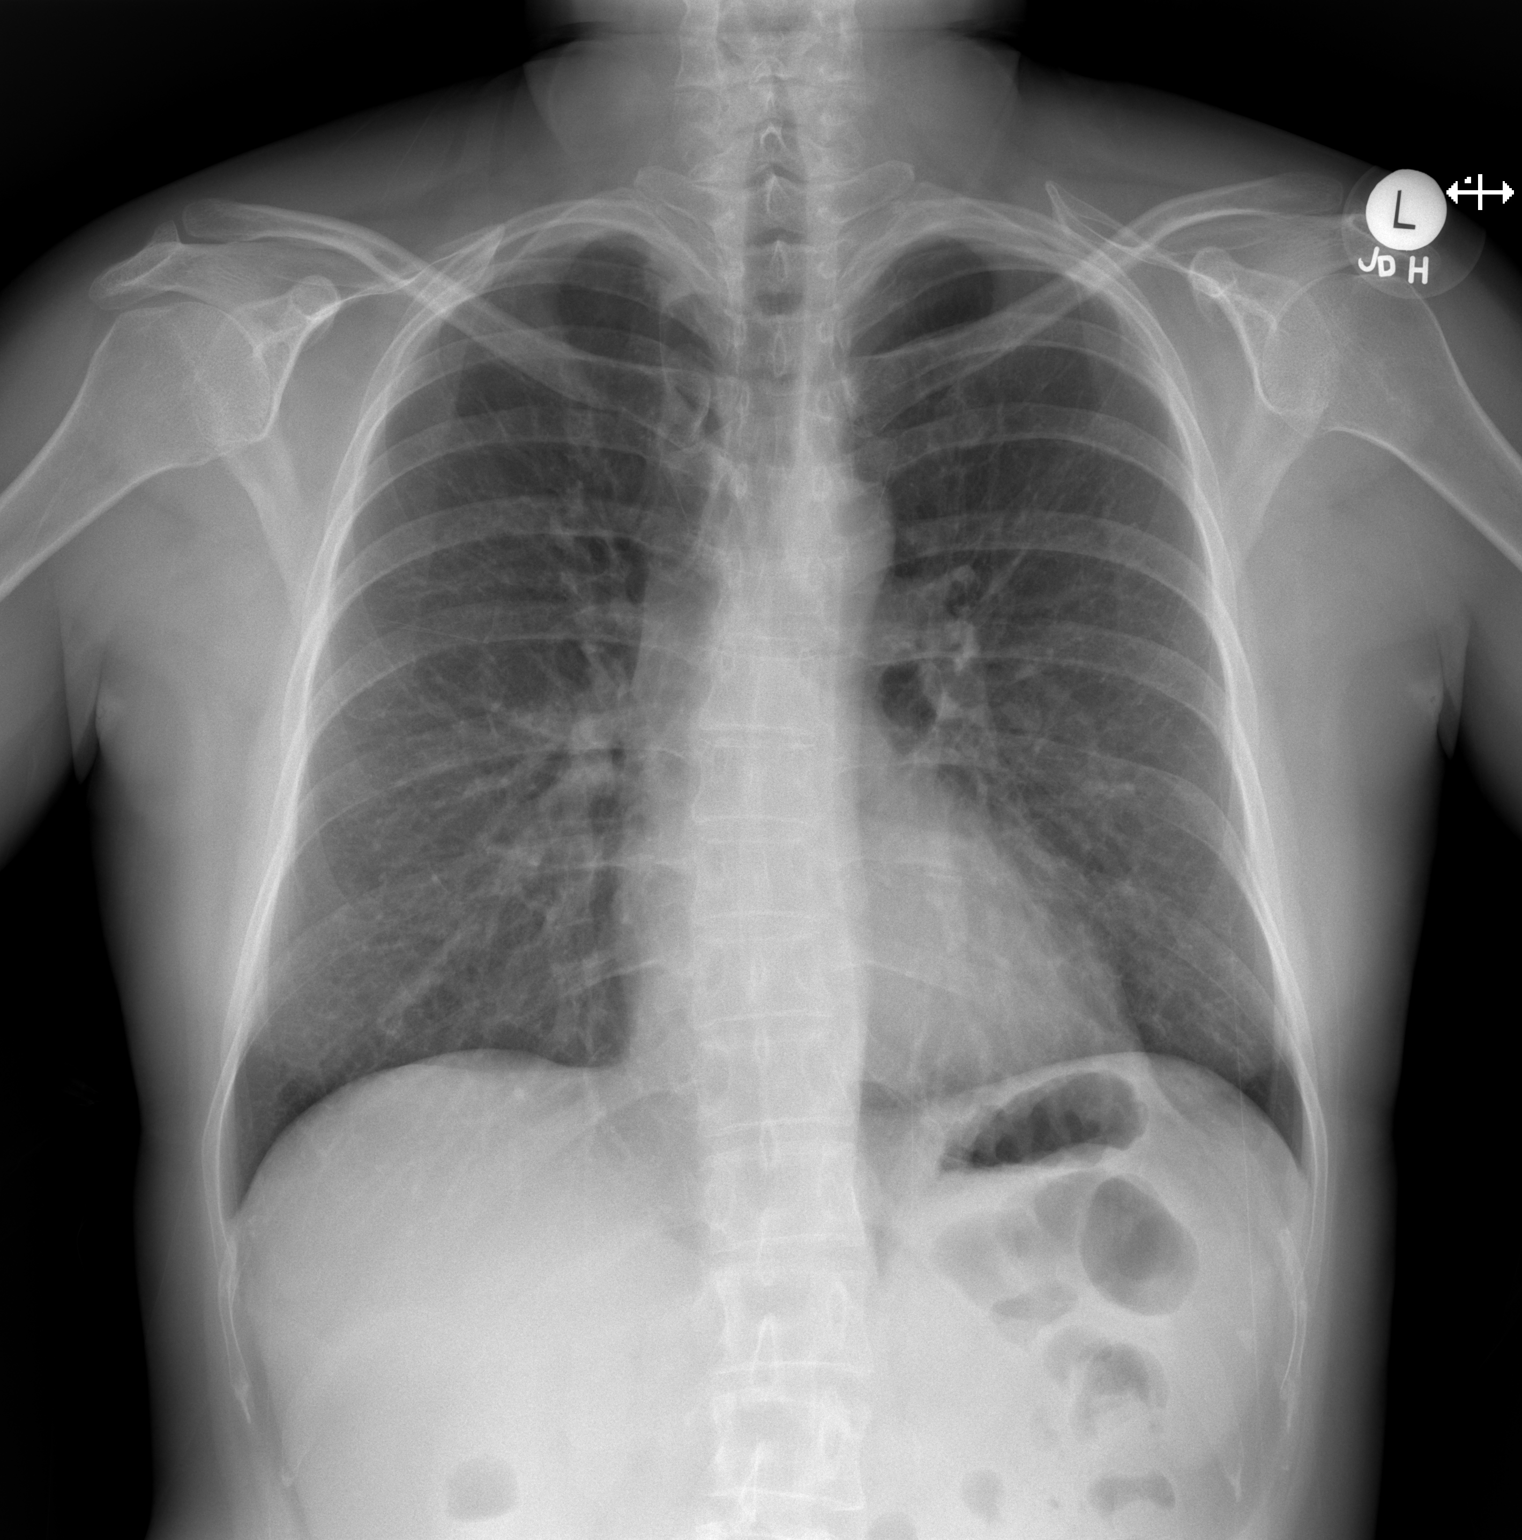

[1 of 1 positions shown; findings below may reference images not displayed]

FINDINGS: The lungs are adequately inflated. There is no focal infiltrate.
There is no pleural effusion. The heart and pulmonary vascularity
are normal. The mediastinum is normal in width. There is gentle S
shaped thoracolumbar scoliosis.
IMPRESSION: There is no active cardiopulmonary disease.

## 2018-12-05 IMAGING — DX DG LUMBAR SPINE 2-3V
2 series · 2 of 2 positions shown · non-contrast
Comparison: 02/18/2016.

CLINICAL DATA: Lumbar surgery.

EXAM:
LUMBAR SPINE - 2-3 VIEW

[l-spine ap]
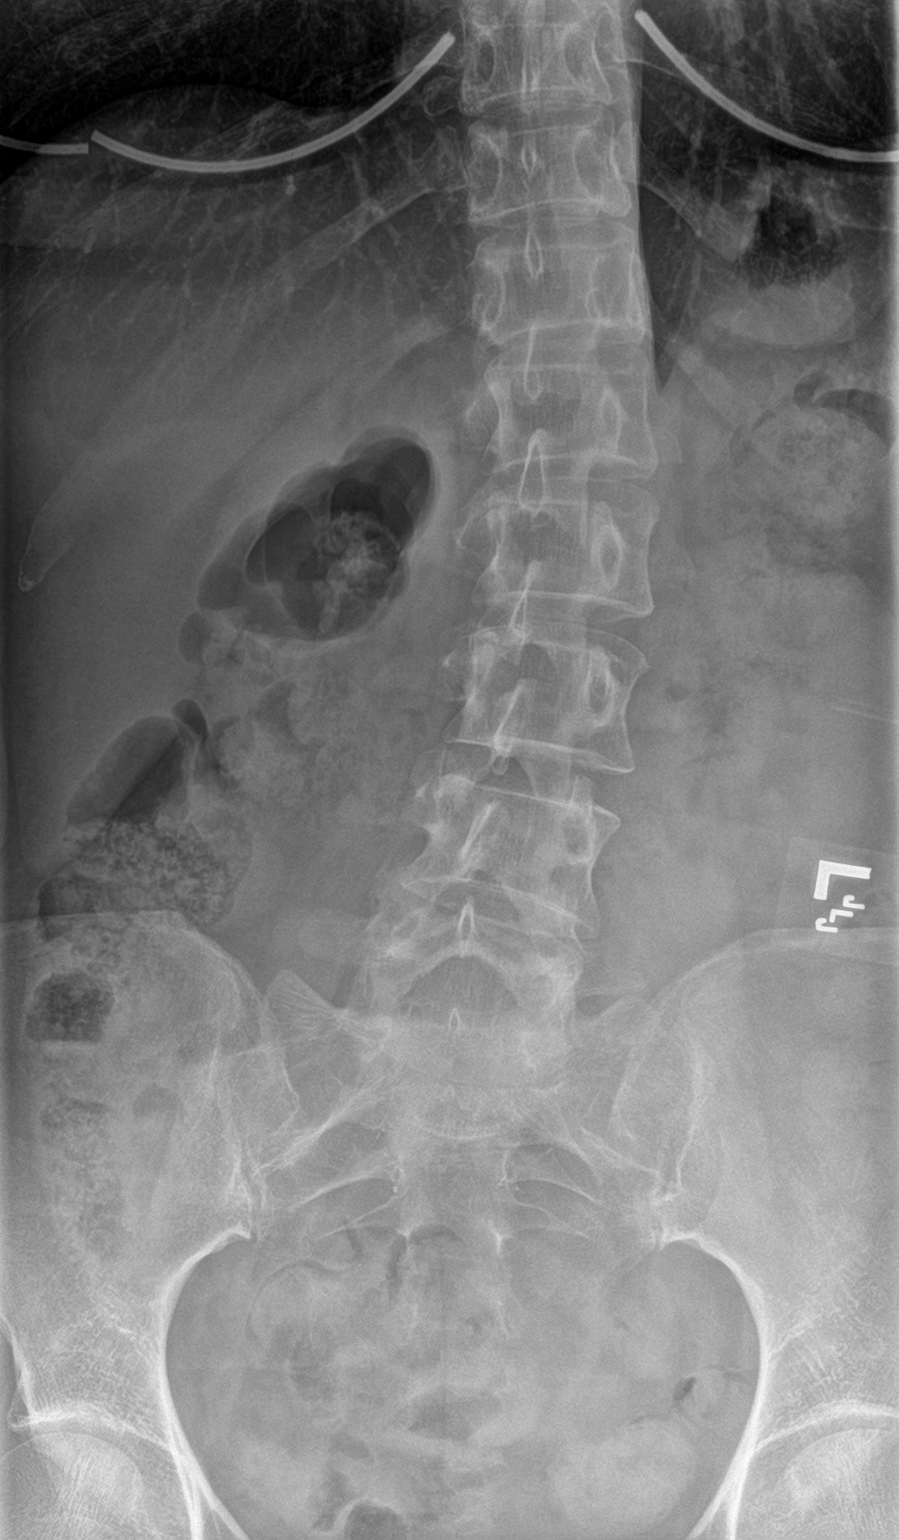

[l-spine lat]
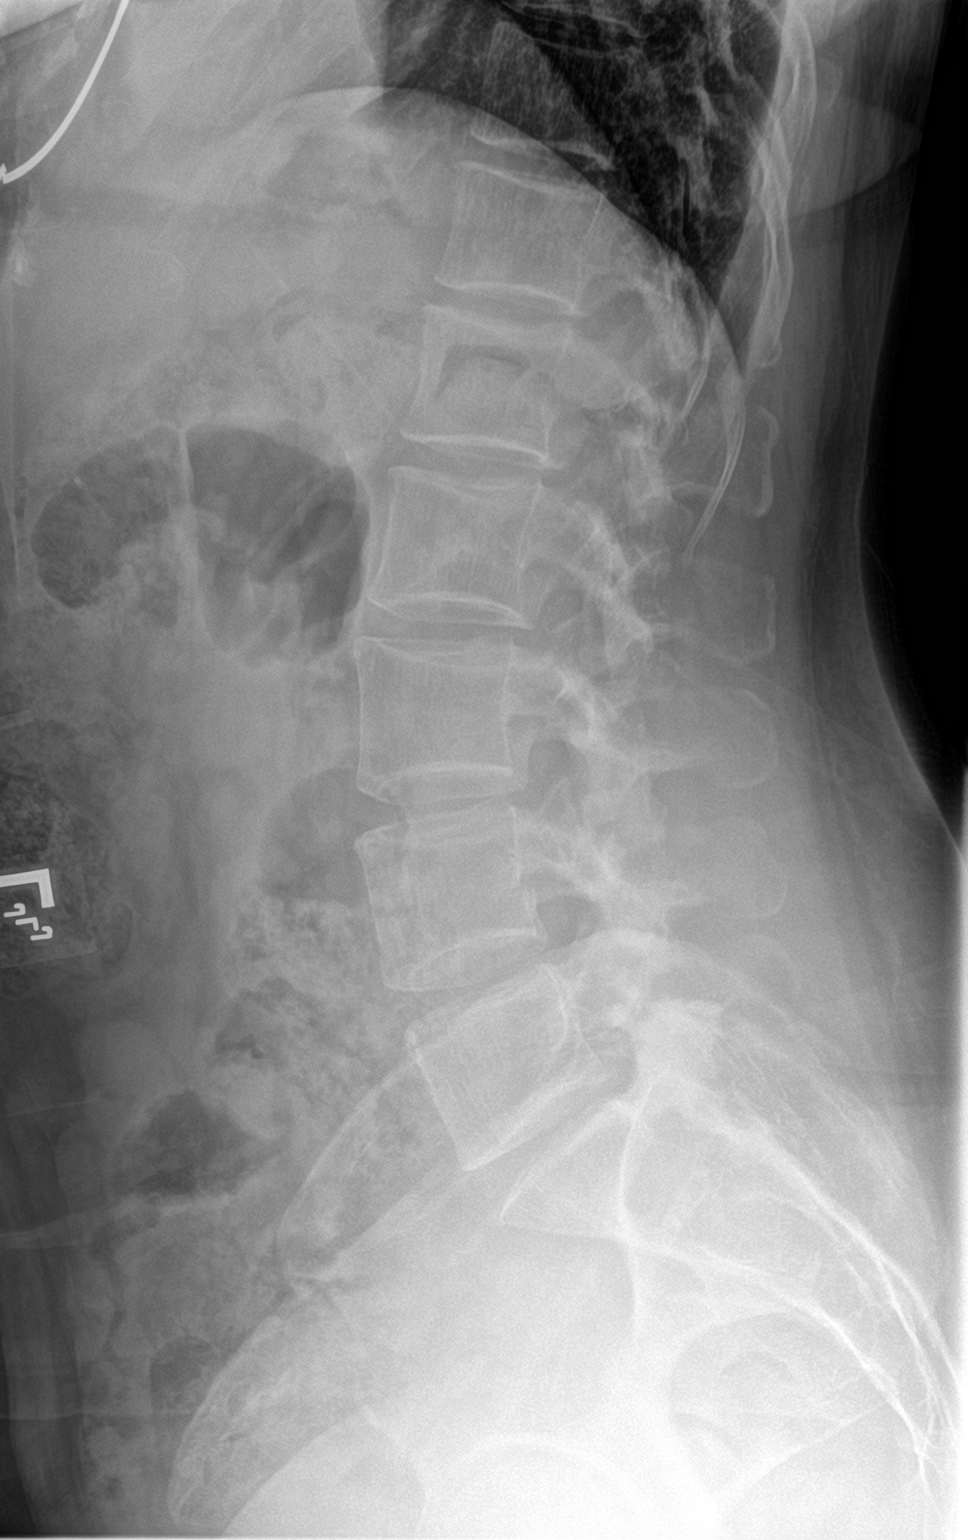

[2 of 2 positions shown; findings below may reference images not displayed]

FINDINGS: Scoliosis lumbar spine concave right. Mild degenerative change. No
acute or focal bony abnormality. Soft tissue structures are
unremarkable. Exam stable from prior exam.
IMPRESSION: Scoliosis lumbar spine concave right. Mild degenerative change. No
acute or focal abnormality identified.

## 2018-12-07 IMAGING — DX DG SPINE 1V PORT
1 series · 1 of 1 positions shown · non-contrast
Comparison: To films from the same day.

CLINICAL DATA: Surgery,.  Intraoperative localization.

EXAM:
PORTABLE SPINE - 1 VIEW

[l-spine lat]
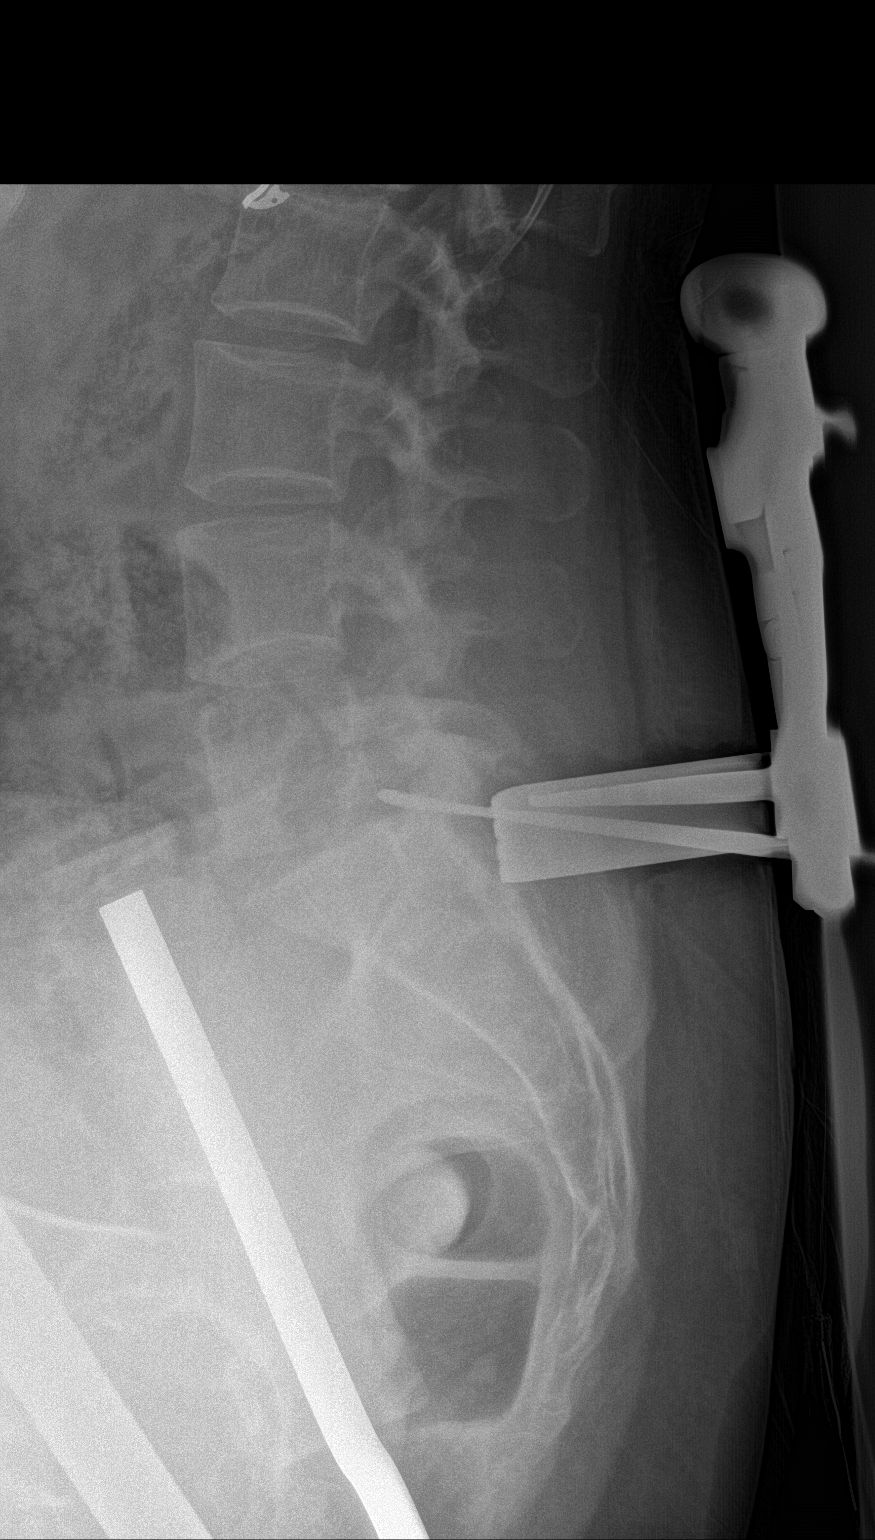

[1 of 1 positions shown; findings below may reference images not displayed]

FINDINGS: A surgical probe is directed at the L5-S1 interspace. Inferior
endplate of L5.
IMPRESSION: Intraoperative localization of the L5-S1 disc space.

## 2021-12-24 ENCOUNTER — Emergency Department (HOSPITAL_COMMUNITY)
Admission: EM | Admit: 2021-12-24 | Discharge: 2021-12-24 | Disposition: A | Discharge status: Home / Self Care | Payer: 59 | Attending: Emergency Medicine | Admitting: Emergency Medicine

## 2021-12-24 ENCOUNTER — Other Ambulatory Visit: Payer: Self-pay

## 2021-12-24 ENCOUNTER — Emergency Department (HOSPITAL_COMMUNITY): Payer: 59

## 2021-12-24 ENCOUNTER — Encounter (HOSPITAL_COMMUNITY): Payer: Self-pay

## 2021-12-24 DIAGNOSIS — R0789 Other chest pain: Secondary | ICD-10-CM | POA: Insufficient documentation

## 2021-12-24 DIAGNOSIS — R072 Precordial pain: Secondary | ICD-10-CM | POA: Diagnosis present

## 2021-12-24 LAB — BASIC METABOLIC PANEL
Anion gap: 9 (ref 5–15)
BUN: 9 mg/dL (ref 6–20)
CO2: 22 mmol/L (ref 22–32)
Calcium: 9.9 mg/dL (ref 8.9–10.3)
Chloride: 107 mmol/L (ref 98–111)
Creatinine, Ser: 0.73 mg/dL (ref 0.44–1.00)
GFR, Estimated: >60 mL/min (ref >60)
Glucose, Bld: 90 mg/dL (ref 70–99)
Potassium: 4.3 mmol/L (ref 3.5–5.1)
Sodium: 138 mmol/L (ref 135–145)

## 2021-12-24 LAB — CBC
HCT: 46.8 % — ABNORMAL HIGH (ref 36.0–46.0)
Hemoglobin: 15.6 g/dL — ABNORMAL HIGH (ref 12.0–15.0)
MCH: 29.9 pg (ref 26.0–34.0)
MCHC: 33.3 g/dL (ref 30.0–36.0)
MCV: 89.8 fL (ref 80.0–100.0)
Platelets: 319 10*3/uL (ref 150–400)
RBC: 5.21 MIL/uL — ABNORMAL HIGH (ref 3.87–5.11)
RDW: 13.4 % (ref 11.5–15.5)
WBC: 9.3 10*3/uL (ref 4.0–10.5)
nRBC: 0 % (ref 0.0–0.2)

## 2021-12-24 LAB — I-STAT BETA HCG BLOOD, ED (MC, WL, AP ONLY): I-stat hCG, quantitative: <5 m[IU]/mL (ref <5)

## 2021-12-24 LAB — TROPONIN I (HIGH SENSITIVITY)
Troponin I (High Sensitivity): 3 ng/L (ref <18)
Troponin I (High Sensitivity): 3 ng/L (ref <18)

## 2021-12-24 MED ORDER — KETOROLAC TROMETHAMINE 15 MG/ML IJ SOLN
15.0000 mg | Freq: Once | INTRAMUSCULAR | Status: AC
  Administered 2021-12-24: 15 mg via INTRAVENOUS
  Filled 2021-12-24: qty 1

## 2021-12-24 NOTE — ED Provider Triage Note (Signed)
Emergency Medicine Provider Triage Evaluation Note  [name] , a 51 y.o. female  was evaluated in triage.  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  Denies prior history of CAD or MI.  Does endorse significant family history of CAD.  She took aspirin prior to arrival.  Reports her pain has subsided.  Pain did not radiate, and was not associated with diaphoresis, nausea, vomiting.  Review of Systems  Positive: As above Negative: As above  Physical Exam  BP 118/78 (BP Location: Right Arm)   Pulse 81   Temp 98.5 F (36.9 C) (Oral)   Resp 15   Ht 5\' 3"  (1.6 m)   Wt 72.6 kg   SpO2 97%   BMI 28.34 kg/m  Gen:   Awake, no distress   Resp:  Normal effort  MSK:   Moves extremities without difficulty  Other:    Medical Decision Making  Medically screening exam initiated at 3:48 PM.  Appropriate orders placed.  was informed that the remainder of the evaluation will be completed by another provider, this initial triage assessment does not replace that evaluation, and the importance of remaining in the ED until their evaluation is complete.     Delton Coombes, PA-C 12/24/21 1549

## 2021-12-24 NOTE — ED Provider Notes (Signed)
[facility] EMERGENCY DEPARTMENT Provider Note   CSN: [id number] Arrival date & time: 12/24/21  1512     History  Chief Complaint  Patient presents with   Chest Pain    [name] is a 51 y.o. female with a PMHx of HLD, who presents to the Emergency Department complaining of substernal, nonradiating, CP onset prior to arrival.  Patient stated that she was working at a desk.  Took 325 mg aspirin and TUMS prior to arrival with relief of her symptoms. No recent heavy lifting, trauma, injury, fall. Notes that she recently canned tomatoes and in a.m. awkward position this week.  At the time she was having back pain that was alleviated with her prescription muscle relaxer.  Last dose of muscle relaxer was on yesterday. No asthma, CAD, COPD, CHF.  Denies prior MI, CAD, cardiac catheterization, stents.  Denies cough, shortness of breath, abdominal pain, nausea, vomiting.  The history is provided by the patient. No language interpreter was used.       Home Medications Prior to Admission medications   Medication Sig Start Date End Date Taking? Authorizing Provider  aspirin 81 MG chewable tablet Chew 1 tablet (81 mg total) by mouth daily. May resume 4 days post-op Patient not taking: Reported on 08/26/2016 08/04/16   Dorothy Spark, PA-C  cephALEXin (KEFLEX) 500 MG capsule Take 1 capsule (500 mg total) by mouth 4 (four) times daily. 08/26/16   Salley Scarlet, MD  docusate sodium (COLACE) 100 MG capsule Take 1 capsule (100 mg total) by mouth 2 (two) times daily as needed for mild constipation. Patient not taking: Reported on 08/26/2016 08/03/16   Jene Every, MD  gabapentin (NEURONTIN) 300 MG capsule Take 1 capsule by mouth 3 (three) times daily. 06/29/16   [provider]  ibuprofen (ADVIL,MOTRIN) 200 MG tablet Take 400 mg by mouth every 6 (six) hours as needed.    [provider]  meloxicam (MOBIC) 7.5 MG tablet Take 1 tablet (7.5 mg total) by  mouth 2 (two) times daily. May resume 5 days post-op Patient not taking: Reported on 08/26/2016 08/04/16   Dorothy Spark, PA-C  methocarbamol (ROBAXIN) 500 MG tablet Take 1 tablet (500 mg total) by mouth every 6 (six) hours as needed for muscle spasms. Patient not taking: Reported on 08/26/2016 08/03/16   Jene Every, MD  oxyCODONE-acetaminophen (PERCOCET) 5-325 MG tablet Take 1-2 tablets by mouth every 4 (four) hours as needed for severe pain. Patient not taking: Reported on 08/26/2016 08/03/16   Jene Every, MD  polyethylene glycol [facility] Ethelene Hal) packet Take 17 g by mouth daily. Patient not taking: Reported on 08/26/2016 08/03/16   Jene Every, MD      Allergies    Patient has no known allergies.    Review of Systems   Review of Systems  Respiratory:  Negative for cough and shortness of breath.   Cardiovascular:  Positive for chest pain.  Gastrointestinal:  Negative for abdominal pain, nausea and vomiting.  All other systems reviewed and are negative.   Physical Exam Updated Vital Signs BP 118/78 (BP Location: Right Arm)   Pulse 81   Temp 98.5 F (36.9 C) (Oral)   Resp 15   Ht 5\' 3"  (1.6 m)   Wt 72.6 kg   SpO2 97%   BMI 28.34 kg/m  Physical Exam Vitals and nursing note reviewed.  Constitutional:      General: She is not in acute distress.    Appearance:  She is not diaphoretic.  HENT:     Head: Normocephalic and atraumatic.     Mouth/Throat:     Pharynx: No oropharyngeal exudate.  Eyes:     General: No scleral icterus.    Conjunctiva/sclera: Conjunctivae normal.  Cardiovascular:     Rate and Rhythm: Normal rate and regular rhythm.     Pulses: Normal pulses.     Heart sounds: Normal heart sounds.  Pulmonary:     Effort: Pulmonary effort is normal. No respiratory distress.     Breath sounds: Normal breath sounds. No wheezing.  Chest:     Chest wall: Tenderness present.     Comments: Tenderness to palpation noted to sternal chest wall without overlying  skin changes. Abdominal:     General: Bowel sounds are normal.     Palpations: Abdomen is soft. There is no mass.     Tenderness: There is no abdominal tenderness. There is no guarding or rebound.  Musculoskeletal:        General: Normal range of motion.     Cervical back: Normal range of motion and neck supple.  Skin:    General: Skin is warm and dry.  Neurological:     Mental Status: She is alert.  Psychiatric:        Behavior: Behavior normal.    ED Results / Procedures / Treatments   Labs (all labs ordered are listed, but only abnormal results are displayed) Labs Reviewed  CBC - Abnormal; Notable for the following components:      Result Value   RBC 5.21 (*)    Hemoglobin 15.6 (*)    HCT 46.8 (*)    All other components within normal limits  BASIC METABOLIC PANEL  I-STAT BETA HCG BLOOD, ED ([facility], [facility], [facility] ONLY)  TROPONIN I (HIGH SENSITIVITY)  TROPONIN I (HIGH SENSITIVITY)    EKG EKG Interpretation  Date/Time:  Friday December 24 2021 15:33:56 EDT Ventricular Rate:  69 PR Interval:  160 QRS Duration: 88 QT Interval:  360 QTC Calculation: 385 R Axis:   63 Text Interpretation: Normal sinus rhythm with sinus arrhythmia Normal ECG No previous ECGs available Confirmed by Alvino Blood ([id number]) on 12/24/2021 6:28:32 PM  Radiology DG Chest 2 View  Result Date: 12/24/2021 CLINICAL DATA:  Chest pain EXAM: CHEST - 2 VIEW COMPARISON:  12/18/2015 FINDINGS: The heart size and mediastinal contours are within normal limits. Both lungs are clear. Azygous fissure is seen. The visualized skeletal structures are unremarkable. IMPRESSION: No active cardiopulmonary disease. Electronically Signed   By: Ernie Avena M.D.   On: 12/24/2021 16:05    Procedures Procedures    Medications Ordered in ED Medications  ketorolac (TORADOL) 15 MG/ML injection 15 mg (15 mg Intravenous Given 12/24/21 1839)    ED Course/ Medical Decision Making/ A&P Clinical Course as of 12/24/21 2000  Fri  Dec 24, 2021  1947 Pt noted improvement of symptoms with treatment regimen in the ED. Pt able to ambulate without assistance or difficulty. [SB]  1956 Discussed with patient discharge treatment plan. Answered all available questions. Pt appears safe for discharge at this time.  [SB]    Clinical Course User Index [SB] Massiah Longanecker A, PA-C                           Medical Decision Making Amount and/or Complexity of Data Reviewed Labs: ordered. Radiology: ordered.  Risk Prescription drug management.   Patient presents to the ED with  substernal, nonradiating chest pain onset prior to arrival. No prior history of MI, catheterization, DVT/PE. Vital signs patient afebrile, patient not hypoxic or tachycardic. On exam patient with Tenderness to palpation noted to sternal chest wall without overlying skin changes. Otherwise, no acute cardiovascular, respiratory, abdominal findings. Differential diagnosis includes ACS, aortic dissection, pneumothorax, PE, PNA.     EKG:  I personally reviewed and interpreted the EKG along with my attending.  The results are as follows: No acute ST/T changes.  NSR..  Labs:  I ordered, and personally interpreted labs.  The pertinent results include:   Initial and delta troponin at 3 CBC without leukocytosis BMP unremarkable. I-STAT beta-hCG undetectable  Imaging: I ordered imaging studies including CXR I independently visualized and interpreted imaging which showed: No acute cardio vascular findings I agree with the radiologist interpretation  Medications:  I ordered medication including Toradol, warm compress for symptom management Reevaluation of the patient after these medicines and interventions, I reevaluated the patient and found that they have improved I have reviewed the patients home medicines and have made adjustments as needed   Disposition: Presentation suspicious for likely musculoskeletal cause of atypical chest pain. EKG without acute  ST/T changes, troponins negative, chest x-ray negative, low suspicion for ACS at this time. Chest x-ray without acute findings, vital signs stable, doubt aortic dissection or pneumothorax at this time.  Heart score, patient at low risk. Case discussed with attending who agrees with discharge treatment plan at this time. After consideration of the diagnostic results and the patients response to treatment, I feel that the patient would benefit from Discharge home. Supportive care measures and strict return precautions discussed with patient at bedside. Pt acknowledges and verbalizes understanding. Pt appears safe for discharge. Follow up as indicated in discharge paperwork.   This chart was dictated using voice recognition software, Dragon. Despite the best efforts of this provider to proofread and correct errors, errors may still occur which can change documentation meaning.  Final Clinical Impression(s) / ED Diagnoses Final diagnoses:  Atypical chest pain    Rx / DC Orders ED Discharge Orders     None         Arhaan Chesnut A, PA-C 12/24/21 2349    Lonell Grandchild, MD 12/25/21 0028

## 2021-12-24 NOTE — ED Notes (Signed)
Patient verbalizes understanding of discharge instructions. Opportunity for questioning and answers were provided. Armband removed by staff, pt discharged from ED. Ambulated out to lobby with husband 

## 2021-12-24 NOTE — Discharge Instructions (Addendum)
It was a pleasure taking care of you today!   Your workup was negative in the ED. You may apply ice or heat to the affected area for 15 minutes at a time.  Ensure to place a barrier between your skin and and the ice/heat.  You may use over-the-counter 1,000 mg Tylenol every 6 hours or 600 mg ibuprofen every 6 hours as needed for pain.  Follow-up with your primary care provider for evaluation of your symptoms. You may return to the ED if you are experiencing increasing/worsening chest pain, shortness of breath, or worsening symptoms.

## 2021-12-24 NOTE — ED Triage Notes (Signed)
BIB EMS was sitting at desk at work and had sudden onset of central chest pain and pain with breathing.  Initially was an 8/10 but with ems it was 2/10.  Took 325mg  ASA prior to ems arrival.  22gLHand.

## 2023-09-07 ENCOUNTER — Encounter: Payer: Self-pay | Admitting: Physical Therapy

## 2023-09-07 ENCOUNTER — Other Ambulatory Visit: Payer: Self-pay

## 2023-09-07 ENCOUNTER — Ambulatory Visit: Attending: Physician Assistant | Admitting: Physical Therapy

## 2023-09-07 DIAGNOSIS — G8929 Other chronic pain: Secondary | ICD-10-CM | POA: Diagnosis present

## 2023-09-07 DIAGNOSIS — M25611 Stiffness of right shoulder, not elsewhere classified: Secondary | ICD-10-CM | POA: Diagnosis present

## 2023-09-07 DIAGNOSIS — M25511 Pain in right shoulder: Secondary | ICD-10-CM | POA: Insufficient documentation

## 2023-09-07 DIAGNOSIS — M6281 Muscle weakness (generalized): Secondary | ICD-10-CM | POA: Insufficient documentation

## 2023-09-07 NOTE — Therapy (Signed)
 OUTPATIENT PHYSICAL THERAPY SHOULDER EVALUATION   Patient Name: [name] MRN: [id number] DOB:[birth year], 53 y.o., female Today's Date: 09/07/2023  END OF SESSION:  PT End of Session - 09/07/23 1622     Visit Number 1    Date for PT Re-Evaluation 11/02/23    Authorization Type [employer] no auth req    PT Start Time 1620    PT Stop Time 1700    PT Time Calculation (min) 40 min    Activity Tolerance Patient tolerated treatment well             Past Medical History:  Diagnosis Date   Anxiety    Complication of anesthesia    slow to wake up   Elevated LFTs    Family history of adverse reaction to anesthesia    mom slow to wake up   Headache    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   History of kidney stones    Hx of migraines    Hyperlipidemia    hx of   Menses, irregular    Pineal gland cyst    Thyroid disease    hx abnormal thyroid levels   Past Surgical History:  Procedure Laterality Date   KIDNEY STONE SURGERY  2009   LUMBAR LAMINECTOMY/DECOMPRESSION MICRODISCECTOMY Left 08/03/2016   Procedure: Microlumbar decompression L5-S1 left;  Surgeon: Orvan Blanch, MD;  Location: [facility];  Service: Orthopedics;  Laterality: Left;   TUBAL LIGATION     WISDOM TOOTH EXTRACTION     Patient Active Problem List   Diagnosis Date Noted   HNP (herniated nucleus pulposus), lumbar 08/03/2016   Smoker 08/14/2013   Vitamin D  deficiency 08/14/2013   Subclinical hypothyroidism 08/14/2013   Pineal gland cyst    Hyperlipidemia    Thyroid disease    Menses, irregular    History of kidney stones    Elevated LFTs    Hx of migraines     PCP: Adeline Hone, PA-C  REFERRING PROVIDER: Karlynn Oyster PA-C  REFERRING DIAG: right adhesive capsulitis  THERAPY DIAG:  Right shoulder pain, right shoulder stiffness, weakness  Rationale for Evaluation and Treatment: Rehabilitation  ONSET DATE: 6 months  SUBJECTIVE:                                                                                                                                                                                       SUBJECTIVE STATEMENT: Right shoulder started hurting 6 months ago for no reason  Doctor gave table ex's (flexion, abduction), doorway and turn body wall stretch Hand dominance: Right  PERTINENT HISTORY: Menopause at age 6; non diabetic Back surgery 2017  PAIN:   Are you  having pain? Yes NPRS scale: 1-2/10, move it the wrong way a 20 Pain location: right shoulder, shoots to elbow Pain orientation: Right  PAIN TYPE: throbbing Pain description: constant  Aggravating factors: pain at night arm behind back, hard to comb hair and hook my bra; wiping myself after I go to the bathroom Relieving factors: heat, ice, aspirin  ; sitting   PRECAUTIONS: None    WEIGHT BEARING RESTRICTIONS: No  FALLS:  Has patient fallen in last 6 months? No   OCCUPATION: Light duty-computer work uses a mouse/keyboard; usually take boxes and trash out but someone else helps with that now  PLOF: Independent  PATIENT GOALS:be able to do my hair, wipe myself, put bra on, get dressed without pain   NEXT MD VISIT:   OBJECTIVE:  Note: Objective measures were completed at Evaluation unless otherwise noted.  DIAGNOSTIC FINDINGS:  MRI IMAGING: IMPRESSION: 1. Tendinosis of the rotator cuff without focal tear. 2. Thickening and increased signal intensity of the inferior capsule the axillary pouch with small joint effusion and proliferative tissue of the joint. This is nonspecific and may be related to previous injury of the inferior glenohumeral ligament or adhesive capsulitis. 3. Trace apparent reactive fluid of the subacromial subdeltoid bursa. 4. Degenerative labral changes with probable type I SLAP.   PATIENT SURVEYS:  Quick Dash 75% severe difficulty with ADLs  COGNITION: Overall cognitive status: Within functional limits for tasks assessed      UPPER EXTREMITY ROM:  cervical ROM WFLs except  mild limitation with extension and sidebending (not painful); painful with scapular retraction  Active ROM Right eval Left eval  Shoulder flexion 95 158  Shoulder extension    Shoulder abduction 76 152  Shoulder adduction    Shoulder internal rotation Right buttock  Radial styloid T8  Shoulder external rotation Ulnar styloid to ear 35 Ulnar styloid to C4;45  Elbow flexion WFLs WFLs  Elbow extension 0 0  Wrist flexion    Wrist extension    Wrist ulnar deviation    Wrist radial deviation    Wrist pronation    Wrist supination    (Blank rows = not tested)  UPPER EXTREMITY MMT:  MMT Right eval Left eval  Shoulder flexion 3- 5  Shoulder extension 4 5  Shoulder abduction 3- 5  Shoulder adduction    Shoulder internal rotation 3 5  Shoulder external rotation 3 5  Middle trapezius 3 5  Lower trapezius 3 5  Elbow flexion 4 5  Elbow extension 4- 5  Wrist flexion    Wrist extension    Wrist ulnar deviation    Wrist radial deviation    Wrist pronation    Wrist supination    Grip strength (lbs) 35# 50#  (Blank rows = not tested)  JOINT MOBILITY TESTING:  Glenohumeral and scapular hypomobility; pain relief with glenohumeral distraction  PALPATION:  Tender points in upper traps  TREATMENT DATE: 09/07/23 Evaluation Initial HEP Discussed low load long duration stretching every 2 hours   PATIENT EDUCATION: Education details: Educated patient on anatomy and physiology of current symptoms, prognosis, plan of care as well as initial self care strategies to promote recovery Person educated: Patient Education method: Explanation Education comprehension: verbalized understanding  HOME EXERCISE PROGRAM: Access Code: [email or web address] URL: [email or web address] Date: 09/07/2023 Prepared by: Darien Eden  Exercises - Supine Shoulder Flexion PROM  (Mirrored)  - 1 x daily - 7 x weekly - 1 sets - 3-5 reps - 30 hold - Standing Shoulder Inferior Glide with Towel Roll (Mirrored)  - 1 x daily - 7 x weekly - 1 sets - 10 reps - Circular Shoulder Pendulum with Table Support  - 1 x daily - 7 x weekly - 1 sets - 10 reps  ASSESSMENT:  CLINICAL IMPRESSION: Patient is a 53 y.o. female who was seen today for physical therapy evaluation and treatment for right shoulder adhesive capsulitis. The patient would benefit from skilled PT to address shoulder range of motion limitations particularly shoulder elevation and internal rotation as well as glenohumeral, scapular and thoracic joint hypomobility.  The patient would also benefit from strengthening to correct asymmetries including weakness in rotator cuff and periscapular musculature and decreasing overuse of the upper trapezius.  These impairments  and pain contribute to difficulties with home and work ADLS like sleeping, reaching overhead, lifting objects to higher shelves, reaching behind the back and with carrying heavier objects.     OBJECTIVE IMPAIRMENTS: decreased activity tolerance, decreased ROM, decreased strength, hypomobility, increased fascial restrictions, impaired perceived functional ability, impaired UE functional use, and pain.   ACTIVITY LIMITATIONS: carrying, lifting, sleeping, toileting, dressing, reach over head, and hygiene/grooming  PARTICIPATION LIMITATIONS: meal prep, cleaning, laundry, and occupation  PERSONAL FACTORS: Time since onset of injury/illness/exacerbation are also affecting patient's functional outcome.   REHAB POTENTIAL: Good  CLINICAL DECISION MAKING: Stable/uncomplicated  EVALUATION COMPLEXITY: Low   GOALS: Goals reviewed with patient? Yes  SHORT TERM GOALS: Target date: 10/05/2023   The patient will demonstrate knowledge of basic self care strategies and exercises to promote healing  Baseline: Goal status: INITIAL  2.  Improved right shoulder extension  and internal rotation ROM needed for toileting/wiping Baseline:  Goal status: INITIAL  3.  Improved right shoulder flexion to 108 degrees needed for combing her hair Baseline:  Goal status: INITIAL  4.  Improved grip strength to 42# needed for carrying bags or boxes Baseline:  Goal status: INITIAL     LONG TERM GOALS: Target date: 11/02/2023    The patient will be independent in a safe self progression of a home exercise program to promote further recovery of function   Baseline:  Goal status: INITIAL  2.  The patient will have improved shoulder elevation ROM to at least 120 degrees needed for grooming/dressing purposes as well as reaching high shelves  Baseline:  Goal status: INITIAL  3.  The patient will have grossly 4-/5 strength needed to lift and lower a 2# object from a high shelf  Baseline:  Goal status: INITIAL  4.  The patient will report a 60% improvement in pain levels with functional activities which are currently difficult including sleeping, grooming and dressing tasks Baseline:  Goal status: INITIAL  5.  Quick DASH score improved to 62% indicating improved function with less pain Baseline:  Goal status: INITIAL   PLAN:  PT FREQUENCY: 1-2x/week  PT DURATION: 8 weeks  PLANNED INTERVENTIONS: 19147- PT Re-evaluation, 97110-Therapeutic  exercises, 97530- Therapeutic activity, [id number]- Neuromuscular re-education, [phone]- Self Care, 21308- Manual therapy, [phone]- Aquatic Therapy, [phone]- Electrical stimulation (unattended), [phone]- Electrical stimulation (manual), [phone]- Ionotophoresis 4mg /ml Dexamethasone , Patient/Family education, Taping, Dry Needling, Joint mobilization, Spinal mobilization, Cryotherapy, and Moist heat  PLAN FOR NEXT SESSION: DN if patient agreeable to upper traps, rotator cuff, deltoids; progress HEP; glenohumeral and scapular joint mobilizations  Darien Eden, PT 09/07/23 9:17 PM Phone: [phone] Fax: [phone]

## 2023-09-18 ENCOUNTER — Ambulatory Visit: Admitting: Physical Therapy

## 2023-09-18 ENCOUNTER — Encounter: Payer: Self-pay | Admitting: Physical Therapy

## 2023-09-18 DIAGNOSIS — M6281 Muscle weakness (generalized): Secondary | ICD-10-CM

## 2023-09-18 DIAGNOSIS — M25511 Pain in right shoulder: Secondary | ICD-10-CM | POA: Diagnosis not present

## 2023-09-18 DIAGNOSIS — G8929 Other chronic pain: Secondary | ICD-10-CM

## 2023-09-18 DIAGNOSIS — M25611 Stiffness of right shoulder, not elsewhere classified: Secondary | ICD-10-CM

## 2023-09-18 NOTE — Patient Instructions (Signed)

## 2023-09-18 NOTE — Therapy (Signed)
 OUTPATIENT PHYSICAL THERAPY SHOULDER TREATMENT   Patient Name: [name] MRN: [id number] DOB:06-09-1970, 53 y.o., female Today's Date: 09/18/2023  END OF SESSION:  PT End of Session - 09/18/23 1320     Visit Number 2    Date for PT Re-Evaluation 11/02/23    Authorization Type [employer] no auth req    PT Start Time 1232    PT Stop Time 1313    PT Time Calculation (min) 41 min    Activity Tolerance Patient tolerated treatment well    Behavior During Therapy WFL for tasks assessed/performed              Past Medical History:  Diagnosis Date   Anxiety    Complication of anesthesia    slow to wake up   Elevated LFTs    Family history of adverse reaction to anesthesia    mom slow to wake up   Headache    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   History of kidney stones    Hx of migraines    Hyperlipidemia    hx of   Menses, irregular    Pineal gland cyst    Thyroid disease    hx abnormal thyroid levels   Past Surgical History:  Procedure Laterality Date   KIDNEY STONE SURGERY  2009   LUMBAR LAMINECTOMY/DECOMPRESSION MICRODISCECTOMY Left 08/03/2016   Procedure: Microlumbar decompression L5-S1 left;  Surgeon: Orvan Blanch, MD;  Location: [facility];  Service: Orthopedics;  Laterality: Left;   TUBAL LIGATION     WISDOM TOOTH EXTRACTION     Patient Active Problem List   Diagnosis Date Noted   HNP (herniated nucleus pulposus), lumbar 08/03/2016   Smoker 08/14/2013   Vitamin D  deficiency 08/14/2013   Subclinical hypothyroidism 08/14/2013   Pineal gland cyst    Hyperlipidemia    Thyroid disease    Menses, irregular    History of kidney stones    Elevated LFTs    Hx of migraines     PCP: Adeline Hone, PA-C  REFERRING PROVIDER: Karlynn Oyster PA-C  REFERRING DIAG: right adhesive capsulitis  THERAPY DIAG:  Right shoulder pain, right shoulder stiffness, weakness  Rationale for Evaluation and Treatment: Rehabilitation  ONSET DATE: 6 months  SUBJECTIVE:                                                                                                                                                                                       SUBJECTIVE STATEMENT: Patient reports she is doing okay today. She does not have any pain when her arm is still but it is 10/10 pain when she moves it.    From Eval: Right  shoulder started hurting 6 months ago for no reason  Doctor gave table ex's (flexion, abduction), doorway and turn body wall stretch Hand dominance: Right  PERTINENT HISTORY: Menopause at age 56; non diabetic Back surgery 2017  PAIN:   Are you having pain? Yes NPRS scale: 1-2/10, move it the wrong way a 20 Pain location: right shoulder, shoots to elbow Pain orientation: Right  PAIN TYPE: throbbing Pain description: constant  Aggravating factors: pain at night arm behind back, hard to comb hair and hook my bra; wiping myself after I go to the bathroom Relieving factors: heat, ice, aspirin  ; sitting   PRECAUTIONS: None    WEIGHT BEARING RESTRICTIONS: No  FALLS:  Has patient fallen in last 6 months? No   OCCUPATION: Light duty-computer work uses a mouse/keyboard; usually take boxes and trash out but someone else helps with that now  PLOF: Independent  PATIENT GOALS:be able to do my hair, wipe myself, put bra on, get dressed without pain   NEXT MD VISIT:   OBJECTIVE:  Note: Objective measures were completed at Evaluation unless otherwise noted.  DIAGNOSTIC FINDINGS:  MRI IMAGING: IMPRESSION: 1. Tendinosis of the rotator cuff without focal tear. 2. Thickening and increased signal intensity of the inferior capsule the axillary pouch with small joint effusion and proliferative tissue of the joint. This is nonspecific and may be related to previous injury of the inferior glenohumeral ligament or adhesive capsulitis. 3. Trace apparent reactive fluid of the subacromial subdeltoid bursa. 4. Degenerative labral changes with probable type I  SLAP.   PATIENT SURVEYS:  Quick Dash 75% severe difficulty with ADLs  COGNITION: Overall cognitive status: Within functional limits for tasks assessed      UPPER EXTREMITY ROM:  cervical ROM WFLs except mild limitation with extension and sidebending (not painful); painful with scapular retraction  Active ROM Right eval Left eval  Shoulder flexion 95 158  Shoulder extension    Shoulder abduction 76 152  Shoulder adduction    Shoulder internal rotation Right buttock  Radial styloid T8  Shoulder external rotation Ulnar styloid to ear 35 Ulnar styloid to C4;45  Elbow flexion WFLs WFLs  Elbow extension 0 0  Wrist flexion    Wrist extension    Wrist ulnar deviation    Wrist radial deviation    Wrist pronation    Wrist supination    (Blank rows = not tested)  UPPER EXTREMITY MMT:  MMT Right eval Left eval  Shoulder flexion 3- 5  Shoulder extension 4 5  Shoulder abduction 3- 5  Shoulder adduction    Shoulder internal rotation 3 5  Shoulder external rotation 3 5  Middle trapezius 3 5  Lower trapezius 3 5  Elbow flexion 4 5  Elbow extension 4- 5  Wrist flexion    Wrist extension    Wrist ulnar deviation    Wrist radial deviation    Wrist pronation    Wrist supination    Grip strength (lbs) 35# 50#  (Blank rows = not tested)  JOINT MOBILITY TESTING:  Glenohumeral and scapular hypomobility; pain relief with glenohumeral distraction  PALPATION:  Tender points in upper traps  TREATMENT DATE:  09/18/2023 NuStep Level 1- 5 mins- PT present to discuss status Supine shoulder flexion x 10 4 sec hold Shoulder inferior glide with towel x 10 5 sec holds Shoulder pendulum (circles & sideways) x 10 each direction Shoulder pulley (flexion & abduction) x 2 mins each direction Wall ladder flexion x5 with 5 sec hold at top  Trigger Point Dry  Needling  Initial Treatment: Pt instructed on Dry Needling rational, procedures, and possible side effects. Pt instructed to expect mild to moderate muscle soreness later in the day and/or into the next day.  Pt instructed in methods to reduce muscle soreness. Pt instructed to continue prescribed HEP. Patient verbalized understanding of these instructions and education.   Patient Verbal Consent Given: Yes Education Handout Provided: Yes Muscles Treated: Rt upper trap; Rt supraspinatus & infraspinatus ; Rt rhomboids  Electrical Stimulation Performed: No Treatment Response/Outcome: Utilized skilled palpation to identify trigger points.  During dry needling able to palpate muscle twitch and muscle elongation   Manual STM performed after dry needling specifically Rt upper trap    09/07/23 Evaluation Initial HEP Discussed low load long duration stretching every 2 hours   PATIENT EDUCATION: Education details: Educated patient on anatomy and physiology of current symptoms, prognosis, plan of care as well as initial self care strategies to promote recovery Person educated: Patient Education method: Explanation Education comprehension: verbalized understanding  HOME EXERCISE PROGRAM: Access Code: [email or web address] URL: [email or web address] Date: 09/07/2023 Prepared by: Darien Eden  Exercises - Supine Shoulder Flexion PROM (Mirrored)  - 1 x daily - 7 x weekly - 1 sets - 3-5 reps - 30 hold - Standing Shoulder Inferior Glide with Towel Roll (Mirrored)  - 1 x daily - 7 x weekly - 1 sets - 10 reps - Circular Shoulder Pendulum with Table Support  - 1 x daily - 7 x weekly - 1 sets - 10 reps  ASSESSMENT:  CLINICAL IMPRESSION: [name] presents to therapy with 10/10 pain with shoulder movements but 0/10 at rest. She has been compliant with HEP and verbalized that the supine shoulder flexion was the most challenging exercise. Patient was not able to tolerate dry needling to the right  upper traps due to the intense muscle twitches but felt decreased in muscle tension after first stick. Noted improved ROM and patient verbalized relief after dry needling. Patient will benefit from skilled PT to address the below impairments and improve overall function.   OBJECTIVE IMPAIRMENTS: decreased activity tolerance, decreased ROM, decreased strength, hypomobility, increased fascial restrictions, impaired perceived functional ability, impaired UE functional use, and pain.   ACTIVITY LIMITATIONS: carrying, lifting, sleeping, toileting, dressing, reach over head, and hygiene/grooming  PARTICIPATION LIMITATIONS: meal prep, cleaning, laundry, and occupation  PERSONAL FACTORS: Time since onset of injury/illness/exacerbation are also affecting patient's functional outcome.   REHAB POTENTIAL: Good  CLINICAL DECISION MAKING: Stable/uncomplicated  EVALUATION COMPLEXITY: Low   GOALS: Goals reviewed with patient? Yes  SHORT TERM GOALS: Target date: 10/05/2023   The patient will demonstrate knowledge of basic self care strategies and exercises to promote healing  Baseline: Goal status: INITIAL  2.  Improved right shoulder extension and internal rotation ROM needed for toileting/wiping Baseline:  Goal status: INITIAL  3.  Improved right shoulder flexion to 108 degrees needed for combing her hair Baseline:  Goal status: INITIAL  4.  Improved grip strength to 42# needed for carrying bags or boxes Baseline:  Goal status: INITIAL     LONG TERM GOALS: Target date: 11/02/2023  The patient will be independent in a safe self progression of a home exercise program to promote further recovery of function   Baseline:  Goal status: INITIAL  2.  The patient will have improved shoulder elevation ROM to at least 120 degrees needed for grooming/dressing purposes as well as reaching high shelves  Baseline:  Goal status: INITIAL  3.  The patient will have grossly 4-/5 strength  needed to lift and lower a 2# object from a high shelf  Baseline:  Goal status: INITIAL  4.  The patient will report a 60% improvement in pain levels with functional activities which are currently difficult including sleeping, grooming and dressing tasks Baseline:  Goal status: INITIAL  5.  Quick DASH score improved to 62% indicating improved function with less pain Baseline:  Goal status: INITIAL   PLAN:  PT FREQUENCY: 1-2x/week  PT DURATION: 8 weeks  PLANNED INTERVENTIONS: 97164- PT Re-evaluation, 97110-Therapeutic exercises, 97530- Therapeutic activity, 97112- Neuromuscular re-education, 97535- Self Care, 16109- Manual therapy, [phone]- Aquatic Therapy, [id number]- Electrical stimulation (unattended), [phone]- Electrical stimulation (manual), [phone]- Ionotophoresis 4mg /ml Dexamethasone , Patient/Family education, Taping, Dry Needling, Joint mobilization, Spinal mobilization, Cryotherapy, and Moist heat  PLAN FOR NEXT SESSION: assess response to DN#1; DN to teres muscles  glenohumeral and scapular joint mobilizations  Penelope Bowie, PT 09/18/23 1:21 PM

## 2023-09-22 ENCOUNTER — Ambulatory Visit: Admitting: Physical Therapy

## 2023-09-22 DIAGNOSIS — M25611 Stiffness of right shoulder, not elsewhere classified: Secondary | ICD-10-CM

## 2023-09-22 DIAGNOSIS — M6281 Muscle weakness (generalized): Secondary | ICD-10-CM

## 2023-09-22 DIAGNOSIS — G8929 Other chronic pain: Secondary | ICD-10-CM

## 2023-09-22 DIAGNOSIS — M25511 Pain in right shoulder: Secondary | ICD-10-CM | POA: Diagnosis not present

## 2023-09-22 NOTE — Therapy (Signed)
 OUTPATIENT PHYSICAL THERAPY SHOULDER TREATMENT   Patient Name: [name] MRN: [id number] DOB:04-25-1971, 53 y.o., female Today's Date: 09/22/2023  END OF SESSION:  PT End of Session - 09/22/23 0815     Visit Number 3    Date for PT Re-Evaluation 11/02/23    Authorization Type [employer] no auth req    PT Start Time 0815   late   PT Stop Time 0844    PT Time Calculation (min) 29 min    Activity Tolerance Patient tolerated treatment well              Past Medical History:  Diagnosis Date   Anxiety    Complication of anesthesia    slow to wake up   Elevated LFTs    Family history of adverse reaction to anesthesia    mom slow to wake up   Headache    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   History of kidney stones    Hx of migraines    Hyperlipidemia    hx of   Menses, irregular    Pineal gland cyst    Thyroid disease    hx abnormal thyroid levels   Past Surgical History:  Procedure Laterality Date   KIDNEY STONE SURGERY  2009   LUMBAR LAMINECTOMY/DECOMPRESSION MICRODISCECTOMY Left 08/03/2016   Procedure: Microlumbar decompression L5-S1 left;  Surgeon: Orvan Blanch, MD;  Location: [facility];  Service: Orthopedics;  Laterality: Left;   TUBAL LIGATION     WISDOM TOOTH EXTRACTION     Patient Active Problem List   Diagnosis Date Noted   HNP (herniated nucleus pulposus), lumbar 08/03/2016   Smoker 08/14/2013   Vitamin D  deficiency 08/14/2013   Subclinical hypothyroidism 08/14/2013   Pineal gland cyst    Hyperlipidemia    Thyroid disease    Menses, irregular    History of kidney stones    Elevated LFTs    Hx of migraines     PCP: Adeline Hone, PA-C  REFERRING PROVIDER: Karlynn Oyster PA-C  REFERRING DIAG: right adhesive capsulitis  THERAPY DIAG:  Right shoulder pain, right shoulder stiffness, weakness  Rationale for Evaluation and Treatment: Rehabilitation  ONSET DATE: 6 months  SUBJECTIVE:                                                                                                                                                                                       SUBJECTIVE STATEMENT: It's really sore.  I was OK with DN except for the top one.  I felt the muscle loosen.  Really sore the next day.    From Eval: Right shoulder started hurting 6 months ago for no reason  Doctor gave table ex's (flexion, abduction), doorway and turn body wall stretch Hand dominance: Right  PERTINENT HISTORY: Menopause at age 67; non diabetic Back surgery 2017  PAIN:   Are you having pain? Yes NPRS scale: worse in the AM 10/10 Pain location: right shoulder, shoots to elbow Pain orientation: Right  PAIN TYPE: throbbing Pain description: constant  Aggravating factors: pain at night arm behind back, hard to comb hair and hook my bra; wiping myself after I go to the bathroom Relieving factors: heat, ice, aspirin  ; sitting   PRECAUTIONS: None    WEIGHT BEARING RESTRICTIONS: No  FALLS:  Has patient fallen in last 6 months? No   OCCUPATION: Light duty-computer work uses a mouse/keyboard; usually take boxes and trash out but someone else helps with that now  PLOF: Independent  PATIENT GOALS:be able to do my hair, wipe myself, put bra on, get dressed without pain   NEXT MD VISIT:   OBJECTIVE:  Note: Objective measures were completed at Evaluation unless otherwise noted.  DIAGNOSTIC FINDINGS:  MRI IMAGING: IMPRESSION: 1. Tendinosis of the rotator cuff without focal tear. 2. Thickening and increased signal intensity of the inferior capsule the axillary pouch with small joint effusion and proliferative tissue of the joint. This is nonspecific and may be related to previous injury of the inferior glenohumeral ligament or adhesive capsulitis. 3. Trace apparent reactive fluid of the subacromial subdeltoid bursa. 4. Degenerative labral changes with probable type I SLAP.   PATIENT SURVEYS:  Quick Dash 75% severe difficulty with  ADLs  COGNITION: Overall cognitive status: Within functional limits for tasks assessed      UPPER EXTREMITY ROM:  cervical ROM WFLs except mild limitation with extension and sidebending (not painful); painful with scapular retraction  Active ROM Right eval Left eval  Shoulder flexion 95 158  Shoulder extension    Shoulder abduction 76 152  Shoulder adduction    Shoulder internal rotation Right buttock  Radial styloid T8  Shoulder external rotation Ulnar styloid to ear 35 Ulnar styloid to C4;45  Elbow flexion WFLs WFLs  Elbow extension 0 0  Wrist flexion    Wrist extension    Wrist ulnar deviation    Wrist radial deviation    Wrist pronation    Wrist supination    (Blank rows = not tested)  UPPER EXTREMITY MMT:  MMT Right eval Left eval  Shoulder flexion 3- 5  Shoulder extension 4 5  Shoulder abduction 3- 5  Shoulder adduction    Shoulder internal rotation 3 5  Shoulder external rotation 3 5  Middle trapezius 3 5  Lower trapezius 3 5  Elbow flexion 4 5  Elbow extension 4- 5  Wrist flexion    Wrist extension    Wrist ulnar deviation    Wrist radial deviation    Wrist pronation    Wrist supination    Grip strength (lbs) 35# 50#  (Blank rows = not tested)  JOINT MOBILITY TESTING:  Glenohumeral and scapular hypomobility; pain relief with glenohumeral distraction  PALPATION:  Tender points in upper traps  TREATMENT DATE:  09/22/2023 Moist head concurrent with manual therapy Manual therapy: glenohumeral joint mobs: grade 3 inferior, posterior and distractions, mobs with movement; scapular mobilization medial, lateral, superior and inferior; scapular dissociation Verbal review of HEP Info on shoulder pulleys for home use Shoulder pulley (flexion & abduction) x 2 mins each direction (verbal cue for thumb up with abduction) Standing counter  weight pulleys 3#: flexion and abduction 10x each  09/18/2023 NuStep Level 1- 5 mins- PT present to discuss status Supine shoulder flexion x 10 4 sec hold Shoulder inferior glide with towel x 10 5 sec holds Shoulder pendulum (circles & sideways) x 10 each direction Shoulder pulley (flexion & abduction) x 2 mins each direction Wall ladder flexion x5 with 5 sec hold at top  Trigger Point Dry Needling  Initial Treatment: Pt instructed on Dry Needling rational, procedures, and possible side effects. Pt instructed to expect mild to moderate muscle soreness later in the day and/or into the next day.  Pt instructed in methods to reduce muscle soreness. Pt instructed to continue prescribed HEP. Patient verbalized understanding of these instructions and education.   Patient Verbal Consent Given: Yes Education Handout Provided: Yes Muscles Treated: Rt upper trap; Rt supraspinatus & infraspinatus ; Rt rhomboids  Electrical Stimulation Performed: No Treatment Response/Outcome: Utilized skilled palpation to identify trigger points.  During dry needling able to palpate muscle twitch and muscle elongation   Manual STM performed after dry needling specifically Rt upper trap    09/07/23 Evaluation Initial HEP Discussed low load long duration stretching every 2 hours   PATIENT EDUCATION: Education details: Educated patient on anatomy and physiology of current symptoms, prognosis, plan of care as well as initial self care strategies to promote recovery Person educated: Patient Education method: Explanation Education comprehension: verbalized understanding  HOME EXERCISE PROGRAM: Access Code: [email or web address] URL: [email or web address] Date: 09/07/2023 Prepared by: Darien Eden  Exercises - Supine Shoulder Flexion PROM (Mirrored)  - 1 x daily - 7 x weekly - 1 sets - 3-5 reps - 30 hold - Standing Shoulder Inferior Glide with Towel Roll (Mirrored)  - 1 x daily - 7 x weekly - 1 sets - 10  reps - Circular Shoulder Pendulum with Table Support  - 1 x daily - 7 x weekly - 1 sets - 10 reps  ASSESSMENT:  CLINICAL IMPRESSION: Good response to glenohumeral joint distraction.  Improved scapular mobility following manual therapy although initially very limited.  Therapist providing verbal cues to optimize technique with  exercises in order to achieve the greatest benefit and to reduce pain including thumb up with abduction above 90 degrees.  Discussed pain response with exercise and strategies to keep < 5/10 with ROM.     OBJECTIVE IMPAIRMENTS: decreased activity tolerance, decreased ROM, decreased strength, hypomobility, increased fascial restrictions, impaired perceived functional ability, impaired UE functional use, and pain.   ACTIVITY LIMITATIONS: carrying, lifting, sleeping, toileting, dressing, reach over head, and hygiene/grooming  PARTICIPATION LIMITATIONS: meal prep, cleaning, laundry, and occupation  PERSONAL FACTORS: Time since onset of injury/illness/exacerbation are also affecting patient's functional outcome.   REHAB POTENTIAL: Good  CLINICAL DECISION MAKING: Stable/uncomplicated  EVALUATION COMPLEXITY: Low   GOALS: Goals reviewed with patient? Yes  SHORT TERM GOALS: Target date: 10/05/2023   The patient will demonstrate knowledge of basic self care strategies and exercises to promote healing  Baseline: Goal status: INITIAL  2.  Improved right shoulder extension and internal rotation ROM needed for toileting/wiping Baseline:  Goal status: INITIAL  3.  Improved right  shoulder flexion to 108 degrees needed for combing her hair Baseline:  Goal status: INITIAL  4.  Improved grip strength to 42# needed for carrying bags or boxes Baseline:  Goal status: INITIAL     LONG TERM GOALS: Target date: 11/02/2023    The patient will be independent in a safe self progression of a home exercise program to promote further recovery of function   Baseline:   Goal status: INITIAL  2.  The patient will have improved shoulder elevation ROM to at least 120 degrees needed for grooming/dressing purposes as well as reaching high shelves  Baseline:  Goal status: INITIAL  3.  The patient will have grossly 4-/5 strength needed to lift and lower a 2# object from a high shelf  Baseline:  Goal status: INITIAL  4.  The patient will report a 60% improvement in pain levels with functional activities which are currently difficult including sleeping, grooming and dressing tasks Baseline:  Goal status: INITIAL  5.  Quick DASH score improved to 62% indicating improved function with less pain Baseline:  Goal status: INITIAL   PLAN:  PT FREQUENCY: 1-2x/week  PT DURATION: 8 weeks  PLANNED INTERVENTIONS: 97164- PT Re-evaluation, 97110-Therapeutic exercises, 97530- Therapeutic activity, 97112- Neuromuscular re-education, 97535- Self Care, 98119- Manual therapy, [phone]- Aquatic Therapy, G0283- Electrical stimulation (unattended), [phone]- Electrical stimulation (manual), [phone]- Ionotophoresis 4mg /ml Dexamethasone , Patient/Family education, Taping, Dry Needling, Joint mobilization, Spinal mobilization, Cryotherapy, and Moist heat  PLAN FOR NEXT SESSION: recheck shoulder ROM;  DN#2; DN to teres muscles;  glenohumeral and scapular joint mobilizations;  see how her massage went and if got pulleys for home  Darien Eden, PT 09/22/23 12:22 PM Phone: [phone] Fax: [phone]

## 2023-09-26 ENCOUNTER — Ambulatory Visit: Admitting: Physical Therapy

## 2023-09-26 ENCOUNTER — Encounter: Payer: Self-pay | Admitting: Physical Therapy

## 2023-09-26 DIAGNOSIS — M25511 Pain in right shoulder: Secondary | ICD-10-CM | POA: Diagnosis not present

## 2023-09-26 DIAGNOSIS — M6281 Muscle weakness (generalized): Secondary | ICD-10-CM

## 2023-09-26 DIAGNOSIS — G8929 Other chronic pain: Secondary | ICD-10-CM

## 2023-09-26 DIAGNOSIS — M25611 Stiffness of right shoulder, not elsewhere classified: Secondary | ICD-10-CM

## 2023-09-26 NOTE — Therapy (Signed)
 OUTPATIENT PHYSICAL THERAPY SHOULDER TREATMENT   Patient Name: [name] MRN: [id number] DOB:[birth year], 53 y.o., female Today's Date: 09/26/2023  END OF SESSION:  PT End of Session - 09/26/23 1112     Visit Number 4    Date for PT Re-Evaluation 11/02/23    Authorization Type [employer] no auth req    PT Start Time 1018    PT Stop Time 1101    PT Time Calculation (min) 43 min    Activity Tolerance Patient tolerated treatment well    Behavior During Therapy WFL for tasks assessed/performed               Past Medical History:  Diagnosis Date   Anxiety    Complication of anesthesia    slow to wake up   Elevated LFTs    Family history of adverse reaction to anesthesia    mom slow to wake up   Headache    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   History of kidney stones    Hx of migraines    Hyperlipidemia    hx of   Menses, irregular    Pineal gland cyst    Thyroid disease    hx abnormal thyroid levels   Past Surgical History:  Procedure Laterality Date   KIDNEY STONE SURGERY  2009   LUMBAR LAMINECTOMY/DECOMPRESSION MICRODISCECTOMY Left 08/03/2016   Procedure: Microlumbar decompression L5-S1 left;  Surgeon: Orvan Blanch, MD;  Location: [facility];  Service: Orthopedics;  Laterality: Left;   TUBAL LIGATION     WISDOM TOOTH EXTRACTION     Patient Active Problem List   Diagnosis Date Noted   HNP (herniated nucleus pulposus), lumbar 08/03/2016   Smoker 08/14/2013   Vitamin D  deficiency 08/14/2013   Subclinical hypothyroidism 08/14/2013   Pineal gland cyst    Hyperlipidemia    Thyroid disease    Menses, irregular    History of kidney stones    Elevated LFTs    Hx of migraines     PCP: Adeline Hone, PA-C  REFERRING PROVIDER: Karlynn Oyster PA-C  REFERRING DIAG: right adhesive capsulitis  THERAPY DIAG:  Right shoulder pain, right shoulder stiffness, weakness  Rationale for Evaluation and Treatment: Rehabilitation  ONSET DATE: 6 months  SUBJECTIVE:                                                                                                                                                                                       SUBJECTIVE STATEMENT: Patient reports her shoulder is really sore today. She things she slept funny. Pain 5-6/10. She ordered her pulleys.   From Eval: Right shoulder started hurting 6 months ago for no  reason  Doctor gave table ex's (flexion, abduction), doorway and turn body wall stretch Hand dominance: Right  PERTINENT HISTORY: Menopause at age 48; non diabetic Back surgery 2017  PAIN:   Are you having pain? Yes NPRS scale: worse in the AM 10/10 Pain location: right shoulder, shoots to elbow Pain orientation: Right  PAIN TYPE: throbbing Pain description: constant  Aggravating factors: pain at night arm behind back, hard to comb hair and hook my bra; wiping myself after I go to the bathroom Relieving factors: heat, ice, aspirin  ; sitting   PRECAUTIONS: None    WEIGHT BEARING RESTRICTIONS: No  FALLS:  Has patient fallen in last 6 months? No   OCCUPATION: Light duty-computer work uses a mouse/keyboard; usually take boxes and trash out but someone else helps with that now  PLOF: Independent  PATIENT GOALS:be able to do my hair, wipe myself, put bra on, get dressed without pain   NEXT MD VISIT:   OBJECTIVE:  Note: Objective measures were completed at Evaluation unless otherwise noted.  DIAGNOSTIC FINDINGS:  MRI IMAGING: IMPRESSION: 1. Tendinosis of the rotator cuff without focal tear. 2. Thickening and increased signal intensity of the inferior capsule the axillary pouch with small joint effusion and proliferative tissue of the joint. This is nonspecific and may be related to previous injury of the inferior glenohumeral ligament or adhesive capsulitis. 3. Trace apparent reactive fluid of the subacromial subdeltoid bursa. 4. Degenerative labral changes with probable type I SLAP.   PATIENT SURVEYS:   Quick Dash 75% severe difficulty with ADLs  COGNITION: Overall cognitive status: Within functional limits for tasks assessed      UPPER EXTREMITY ROM:  cervical ROM WFLs except mild limitation with extension and sidebending (not painful); painful with scapular retraction  Active ROM Right Eval (supine) 09/26/2023 Standing Left eval  Shoulder flexion 95 95 158  Shoulder extension     Shoulder abduction 76 65 152  Shoulder adduction     Shoulder internal rotation Right buttock   Radial styloid T8  Shoulder external rotation Ulnar styloid to ear 35  Ulnar styloid to C4;45  Elbow flexion WFLs  WFLs  Elbow extension 0  0  Wrist flexion     Wrist extension     Wrist ulnar deviation     Wrist radial deviation     Wrist pronation     Wrist supination     (Blank rows = not tested)  UPPER EXTREMITY MMT:  MMT Right eval Left eval  Shoulder flexion 3- 5  Shoulder extension 4 5  Shoulder abduction 3- 5  Shoulder adduction    Shoulder internal rotation 3 5  Shoulder external rotation 3 5  Middle trapezius 3 5  Lower trapezius 3 5  Elbow flexion 4 5  Elbow extension 4- 5  Wrist flexion    Wrist extension    Wrist ulnar deviation    Wrist radial deviation    Wrist pronation    Wrist supination    Grip strength (lbs) 35# 50#  (Blank rows = not tested)  JOINT MOBILITY TESTING:  Glenohumeral and scapular hypomobility; pain relief with glenohumeral distraction  PALPATION:  Tender points in upper traps  TREATMENT DATE:  09/26/2023 Moist head concurrent with manual therapy Manual therapy: glenohumeral joint mobs: grade 3 posterior and distractions, scapular mobilization medial, lateral, superior and inferior;  Shoulder pulley (flexion & abduction) x 2 mins each direction (verbal cue for thumb up with abduction) Standing counter weight pulleys 3#: flexion  and abduction 12x each Ball roll ups with red stability ball x 12  Trigger Point Dry Needling  Subsequent Treatment: Instructions provided previously at initial dry needling treatment.   Patient Verbal Consent Given: Yes Education Handout Provided: Previously Provided Muscles Treated: Rt supraspinatus; right teres major/minor; right upper trap Electrical Stimulation Performed: No Treatment Response/Outcome: Utilized skilled palpation to identify trigger points.  During dry needling able to palpate muscle twitch and muscle elongation  Soft tissue mobilization to muscles needled to further promote tissue elongation and decreased pain.        09/22/2023 Moist head concurrent with manual therapy Manual therapy: glenohumeral joint mobs: grade 3 inferior, posterior and distractions, mobs with movement; scapular mobilization medial, lateral, superior and inferior; scapular dissociation Verbal review of HEP Info on shoulder pulleys for home use Shoulder pulley (flexion & abduction) x 2 mins each direction (verbal cue for thumb up with abduction) Standing counter weight pulleys 3#: flexion and abduction 10x each  09/18/2023 NuStep Level 1- 5 mins- PT present to discuss status Supine shoulder flexion x 10 4 sec hold Shoulder inferior glide with towel x 10 5 sec holds Shoulder pendulum (circles & sideways) x 10 each direction Shoulder pulley (flexion & abduction) x 2 mins each direction Wall ladder flexion x5 with 5 sec hold at top  Trigger Point Dry Needling  Initial Treatment: Pt instructed on Dry Needling rational, procedures, and possible side effects. Pt instructed to expect mild to moderate muscle soreness later in the day and/or into the next day.  Pt instructed in methods to reduce muscle soreness. Pt instructed to continue prescribed HEP. Patient verbalized understanding of these instructions and education.   Patient Verbal Consent Given: Yes Education Handout Provided:  Yes Muscles Treated: Rt upper trap; Rt supraspinatus & infraspinatus ; Rt rhomboids  Electrical Stimulation Performed: No Treatment Response/Outcome: Utilized skilled palpation to identify trigger points.  During dry needling able to palpate muscle twitch and muscle elongation   Manual STM performed after dry needling specifically Rt upper trap    PATIENT EDUCATION: Education details: Educated patient on anatomy and physiology of current symptoms, prognosis, plan of care as well as initial self care strategies to promote recovery Person educated: Patient Education method: Explanation Education comprehension: verbalized understanding  HOME EXERCISE PROGRAM: Access Code: [email or web address] URL: [email or web address] Date: 09/07/2023 Prepared by: Darien Eden  Exercises - Supine Shoulder Flexion PROM (Mirrored)  - 1 x daily - 7 x weekly - 1 sets - 3-5 reps - 30 hold - Standing Shoulder Inferior Glide with Towel Roll (Mirrored)  - 1 x daily - 7 x weekly - 1 sets - 10 reps - Circular Shoulder Pendulum with Table Support  - 1 x daily - 7 x weekly - 1 sets - 10 reps  ASSESSMENT:  CLINICAL IMPRESSION: Patient presents with increased soreness in her right shoulder today. Her massage went well and the massage therapist told her she had very tight spots in her teres muscles. Dry needling was successfully at eliciting muscle twitches and muscle elongation. Measured shoulder ROM in standing today after manual and dry needling techniques. Shoulder flexion ROM is the same since eval and abduction has decreased, but measurements were obtained in  standing today. Patient will benefit from skilled PT to address the below impairments and improve overall function.     OBJECTIVE IMPAIRMENTS: decreased activity tolerance, decreased ROM, decreased strength, hypomobility, increased fascial restrictions, impaired perceived functional ability, impaired UE functional use, and pain.   ACTIVITY  LIMITATIONS: carrying, lifting, sleeping, toileting, dressing, reach over head, and hygiene/grooming  PARTICIPATION LIMITATIONS: meal prep, cleaning, laundry, and occupation  PERSONAL FACTORS: Time since onset of injury/illness/exacerbation are also affecting patient's functional outcome.   REHAB POTENTIAL: Good  CLINICAL DECISION MAKING: Stable/uncomplicated  EVALUATION COMPLEXITY: Low   GOALS: Goals reviewed with patient? Yes  SHORT TERM GOALS: Target date: 10/05/2023   The patient will demonstrate knowledge of basic self care strategies and exercises to promote healing  Baseline: Goal status: INITIAL  2.  Improved right shoulder extension and internal rotation ROM needed for toileting/wiping Baseline:  Goal status: INITIAL  3.  Improved right shoulder flexion to 108 degrees needed for combing her hair Baseline:  Goal status: INITIAL  4.  Improved grip strength to 42# needed for carrying bags or boxes Baseline:  Goal status: INITIAL     LONG TERM GOALS: Target date: 11/02/2023    The patient will be independent in a safe self progression of a home exercise program to promote further recovery of function   Baseline:  Goal status: INITIAL  2.  The patient will have improved shoulder elevation ROM to at least 120 degrees needed for grooming/dressing purposes as well as reaching high shelves  Baseline:  Goal status: INITIAL  3.  The patient will have grossly 4-/5 strength needed to lift and lower a 2# object from a high shelf  Baseline:  Goal status: INITIAL  4.  The patient will report a 60% improvement in pain levels with functional activities which are currently difficult including sleeping, grooming and dressing tasks Baseline:  Goal status: INITIAL  5.  Quick DASH score improved to 62% indicating improved function with less pain Baseline:  Goal status: INITIAL   PLAN:  PT FREQUENCY: 1-2x/week  PT DURATION: 8 weeks  PLANNED INTERVENTIONS: 97164-  PT Re-evaluation, 97110-Therapeutic exercises, 97530- Therapeutic activity, 97112- Neuromuscular re-education, 97535- Self Care, 16109- Manual therapy, [phone]- Aquatic Therapy, G0283- Electrical stimulation (unattended), [phone]- Electrical stimulation (manual), [phone]- Ionotophoresis 4mg /ml Dexamethasone , Patient/Family education, Taping, Dry Needling, Joint mobilization, Spinal mobilization, Cryotherapy, and Moist heat  PLAN FOR NEXT SESSION: assess response to DN#2; continue ROM;  glenohumeral and scapular joint mobilizations  Penelope Bowie, PT 09/26/23 11:13 AM

## 2023-09-27 ENCOUNTER — Ambulatory Visit: Admitting: Physical Therapy

## 2023-09-27 DIAGNOSIS — M25611 Stiffness of right shoulder, not elsewhere classified: Secondary | ICD-10-CM

## 2023-09-27 DIAGNOSIS — M25511 Pain in right shoulder: Secondary | ICD-10-CM | POA: Diagnosis not present

## 2023-09-27 DIAGNOSIS — G8929 Other chronic pain: Secondary | ICD-10-CM

## 2023-09-27 DIAGNOSIS — M6281 Muscle weakness (generalized): Secondary | ICD-10-CM

## 2023-09-27 NOTE — Therapy (Signed)
 OUTPATIENT PHYSICAL THERAPY SHOULDER TREATMENT   Patient Name: [name] MRN: [id number] DOB:04/14/1971, 53 y.o., female Today's Date: 09/27/2023  END OF SESSION:  PT End of Session - 09/27/23 1230     Visit Number 5    Date for PT Re-Evaluation 11/02/23    Authorization Type [employer] no auth req    PT Start Time 1230    PT Stop Time 1314    PT Time Calculation (min) 44 min    Activity Tolerance Patient tolerated treatment well               Past Medical History:  Diagnosis Date   Anxiety    Complication of anesthesia    slow to wake up   Elevated LFTs    Family history of adverse reaction to anesthesia    mom slow to wake up   Headache    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   History of kidney stones    Hx of migraines    Hyperlipidemia    hx of   Menses, irregular    Pineal gland cyst    Thyroid disease    hx abnormal thyroid levels   Past Surgical History:  Procedure Laterality Date   KIDNEY STONE SURGERY  2009   LUMBAR LAMINECTOMY/DECOMPRESSION MICRODISCECTOMY Left 08/03/2016   Procedure: Microlumbar decompression L5-S1 left;  Surgeon: Orvan Blanch, MD;  Location: [facility];  Service: Orthopedics;  Laterality: Left;   TUBAL LIGATION     WISDOM TOOTH EXTRACTION     Patient Active Problem List   Diagnosis Date Noted   HNP (herniated nucleus pulposus), lumbar 08/03/2016   Smoker 08/14/2013   Vitamin D  deficiency 08/14/2013   Subclinical hypothyroidism 08/14/2013   Pineal gland cyst    Hyperlipidemia    Thyroid disease    Menses, irregular    History of kidney stones    Elevated LFTs    Hx of migraines     PCP: Adeline Hone, PA-C  REFERRING PROVIDER: Karlynn Oyster PA-C  REFERRING DIAG: right adhesive capsulitis  THERAPY DIAG:  Right shoulder pain, right shoulder stiffness, weakness  Rationale for Evaluation and Treatment: Rehabilitation  ONSET DATE: 6 months  SUBJECTIVE:                                                                                                                                                                                       SUBJECTIVE STATEMENT: I touched myself getting out of the car and it about killed me.  Didn't sleep well.    From Eval: Right shoulder started hurting 6 months ago for no reason  Doctor gave table ex's (flexion, abduction), doorway and turn  body wall stretch Hand dominance: Right  PERTINENT HISTORY: Menopause at age 89; non diabetic Back surgery 2017  PAIN:   Are you having pain? Yes NPRS scale: worse in the AM 10/10 Pain location: right shoulder, shoots to elbow Pain orientation: Right  PAIN TYPE: throbbing Pain description: constant  Aggravating factors: pain at night arm behind back, hard to comb hair and hook my bra; wiping myself after I go to the bathroom Relieving factors: heat, ice, aspirin  ; sitting   PRECAUTIONS: None    WEIGHT BEARING RESTRICTIONS: No  FALLS:  Has patient fallen in last 6 months? No   OCCUPATION: Light duty-computer work uses a mouse/keyboard; usually take boxes and trash out but someone else helps with that now  PLOF: Independent  PATIENT GOALS:be able to do my hair, wipe myself, put bra on, get dressed without pain   NEXT MD VISIT:   OBJECTIVE:  Note: Objective measures were completed at Evaluation unless otherwise noted.  DIAGNOSTIC FINDINGS:  MRI IMAGING: IMPRESSION: 1. Tendinosis of the rotator cuff without focal tear. 2. Thickening and increased signal intensity of the inferior capsule the axillary pouch with small joint effusion and proliferative tissue of the joint. This is nonspecific and may be related to previous injury of the inferior glenohumeral ligament or adhesive capsulitis. 3. Trace apparent reactive fluid of the subacromial subdeltoid bursa. 4. Degenerative labral changes with probable type I SLAP.   PATIENT SURVEYS:  Quick Dash 75% severe difficulty with ADLs  COGNITION: Overall cognitive status: Within  functional limits for tasks assessed      UPPER EXTREMITY ROM:  cervical ROM WFLs except mild limitation with extension and sidebending (not painful); painful with scapular retraction  Active ROM Right Eval (supine) 09/26/2023 Standing Left eval Right  supine  Shoulder flexion 95 95 158 106 supine  Shoulder extension      Shoulder abduction 76 65 152 Sidelying 90 degrees   Shoulder adduction      Shoulder internal rotation Right buttock   Radial styloid T8   Shoulder external rotation Ulnar styloid to ear 35  Ulnar styloid to C4;45   Elbow flexion WFLs  WFLs   Elbow extension 0  0   Wrist flexion      Wrist extension      Wrist ulnar deviation      Wrist radial deviation      Wrist pronation      Wrist supination      (Blank rows = not tested)  UPPER EXTREMITY MMT:  MMT Right eval Left eval  Shoulder flexion 3- 5  Shoulder extension 4 5  Shoulder abduction 3- 5  Shoulder adduction    Shoulder internal rotation 3 5  Shoulder external rotation 3 5  Middle trapezius 3 5  Lower trapezius 3 5  Elbow flexion 4 5  Elbow extension 4- 5  Wrist flexion    Wrist extension    Wrist ulnar deviation    Wrist radial deviation    Wrist pronation    Wrist supination    Grip strength (lbs) 35# 50#  (Blank rows = not tested)  JOINT MOBILITY TESTING:  Glenohumeral and scapular hypomobility; pain relief with glenohumeral distraction  PALPATION:  Tender points in upper traps  TREATMENT DATE:  09/27/2023 Moist heat concurrent with manual therapy Manual therapy: glenohumeral joint mobs: grade 3 inferior, posterior and distractions, mobs with movement; scapular mobilization medial, lateral, superior and inferior; scapular dissociation Pendulum 3# swings 3 minutes UE Ranger on mat table 15x Railing pillow case slides 10x Standing red band bil rows 10x Seated  blue ball rollouts for shoulder ROM 8x UE Ranger on wall 1/3 of the way elevation:  flexion 10x, scaption 10x  09/26/2023 Moist heat concurrent with manual therapy Manual therapy: glenohumeral joint mobs: grade 3 posterior and distractions, scapular mobilization medial, lateral, superior and inferior;  Shoulder pulley (flexion & abduction) x 2 mins each direction (verbal cue for thumb up with abduction) Standing counter weight pulleys 3#: flexion and abduction 12x each Ball roll ups with red stability ball x 12  Trigger Point Dry Needling  Subsequent Treatment: Instructions provided previously at initial dry needling treatment.   Patient Verbal Consent Given: Yes Education Handout Provided: Previously Provided Muscles Treated: Rt supraspinatus; right teres major/minor; right upper trap Electrical Stimulation Performed: No Treatment Response/Outcome: Utilized skilled palpation to identify trigger points.  During dry needling able to palpate muscle twitch and muscle elongation  Soft tissue mobilization to muscles needled to further promote tissue elongation and decreased pain.        09/22/2023 Moist head concurrent with manual therapy Manual therapy: glenohumeral joint mobs: grade 3 inferior, posterior and distractions, mobs with movement; scapular mobilization medial, lateral, superior and inferior; scapular dissociation Verbal review of HEP Info on shoulder pulleys for home use Shoulder pulley (flexion & abduction) x 2 mins each direction (verbal cue for thumb up with abduction) Standing counter weight pulleys 3#: flexion and abduction 10x each  09/18/2023 NuStep Level 1- 5 mins- PT present to discuss status Supine shoulder flexion x 10 4 sec hold Shoulder inferior glide with towel x 10 5 sec holds Shoulder pendulum (circles & sideways) x 10 each direction Shoulder pulley (flexion & abduction) x 2 mins each direction Wall ladder flexion x5 with 5 sec hold at top  Trigger Point  Dry Needling  Initial Treatment: Pt instructed on Dry Needling rational, procedures, and possible side effects. Pt instructed to expect mild to moderate muscle soreness later in the day and/or into the next day.  Pt instructed in methods to reduce muscle soreness. Pt instructed to continue prescribed HEP. Patient verbalized understanding of these instructions and education.   Patient Verbal Consent Given: Yes Education Handout Provided: Yes Muscles Treated: Rt upper trap; Rt supraspinatus & infraspinatus ; Rt rhomboids  Electrical Stimulation Performed: No Treatment Response/Outcome: Utilized skilled palpation to identify trigger points.  During dry needling able to palpate muscle twitch and muscle elongation   Manual STM performed after dry needling specifically Rt upper trap    PATIENT EDUCATION: Education details: Educated patient on anatomy and physiology of current symptoms, prognosis, plan of care as well as initial self care strategies to promote recovery Person educated: Patient Education method: Explanation Education comprehension: verbalized understanding  HOME EXERCISE PROGRAM: Access Code: [email or web address] URL: [email or web address] Date: 09/07/2023 Prepared by: Darien Eden  Exercises - Supine Shoulder Flexion PROM (Mirrored)  - 1 x daily - 7 x weekly - 1 sets - 3-5 reps - 30 hold - Standing Shoulder Inferior Glide with Towel Roll (Mirrored)  - 1 x daily - 7 x weekly - 1 sets - 10 reps - Circular Shoulder Pendulum with Table Support  - 1 x daily - 7 x weekly - 1 sets - 10  reps  ASSESSMENT:  CLINICAL IMPRESSION: Verbal cues to decrease compensatory shrug and to encourage scapular mobilty.  Improved gravity assisted shoulder elevation noted.  Still moderately painful with movement although patient reports decreased pain medicine usage.      OBJECTIVE IMPAIRMENTS: decreased activity tolerance, decreased ROM, decreased strength, hypomobility, increased  fascial restrictions, impaired perceived functional ability, impaired UE functional use, and pain.   ACTIVITY LIMITATIONS: carrying, lifting, sleeping, toileting, dressing, reach over head, and hygiene/grooming  PARTICIPATION LIMITATIONS: meal prep, cleaning, laundry, and occupation  PERSONAL FACTORS: Time since onset of injury/illness/exacerbation are also affecting patient's functional outcome.   REHAB POTENTIAL: Good  CLINICAL DECISION MAKING: Stable/uncomplicated  EVALUATION COMPLEXITY: Low   GOALS: Goals reviewed with patient? Yes  SHORT TERM GOALS: Target date: 10/05/2023   The patient will demonstrate knowledge of basic self care strategies and exercises to promote healing  Baseline: Goal status: INITIAL  2.  Improved right shoulder extension and internal rotation ROM needed for toileting/wiping Baseline:  Goal status: INITIAL  3.  Improved right shoulder flexion to 108 degrees needed for combing her hair Baseline:  Goal status: INITIAL  4.  Improved grip strength to 42# needed for carrying bags or boxes Baseline:  Goal status: INITIAL     LONG TERM GOALS: Target date: 11/02/2023    The patient will be independent in a safe self progression of a home exercise program to promote further recovery of function   Baseline:  Goal status: INITIAL  2.  The patient will have improved shoulder elevation ROM to at least 120 degrees needed for grooming/dressing purposes as well as reaching high shelves  Baseline:  Goal status: INITIAL  3.  The patient will have grossly 4-/5 strength needed to lift and lower a 2# object from a high shelf  Baseline:  Goal status: INITIAL  4.  The patient will report a 60% improvement in pain levels with functional activities which are currently difficult including sleeping, grooming and dressing tasks Baseline:  Goal status: INITIAL  5.  Quick DASH score improved to 62% indicating improved function with less pain Baseline:  Goal  status: INITIAL   PLAN:  PT FREQUENCY: 1-2x/week  PT DURATION: 8 weeks  PLANNED INTERVENTIONS: 97164- PT Re-evaluation, 97110-Therapeutic exercises, 97530- Therapeutic activity, 97112- Neuromuscular re-education, 97535- Self Care, 16109- Manual therapy, [id number]- Aquatic Therapy, G0283- Electrical stimulation (unattended), [phone]- Electrical stimulation (manual), 97033- Ionotophoresis 4mg /ml Dexamethasone , Patient/Family education, Taping, Dry Needling, Joint mobilization, Spinal mobilization, Cryotherapy, and Moist heat  PLAN FOR NEXT SESSION: DN; continue ROM;  glenohumeral and scapular joint mobilizations  Darien Eden, PT 09/27/23 5:21 PM Phone: [phone] Fax: [phone]

## 2023-10-05 ENCOUNTER — Encounter: Payer: Self-pay | Admitting: Physical Therapy

## 2023-10-05 ENCOUNTER — Ambulatory Visit: Admitting: Physical Therapy

## 2023-10-05 DIAGNOSIS — G8929 Other chronic pain: Secondary | ICD-10-CM

## 2023-10-05 DIAGNOSIS — M25611 Stiffness of right shoulder, not elsewhere classified: Secondary | ICD-10-CM

## 2023-10-05 DIAGNOSIS — M25511 Pain in right shoulder: Secondary | ICD-10-CM | POA: Diagnosis not present

## 2023-10-05 DIAGNOSIS — M6281 Muscle weakness (generalized): Secondary | ICD-10-CM

## 2023-10-05 NOTE — Therapy (Signed)
 OUTPATIENT PHYSICAL THERAPY SHOULDER TREATMENT   Patient Name: [name] MRN: [id number] DOB:1970-08-23, 53 y.o., female Today's Date: 10/05/2023  END OF SESSION:  PT End of Session - 10/05/23 1322     Visit Number 6    Date for PT Re-Evaluation 11/02/23    Authorization Type [employer] no auth req    PT Start Time 1234    PT Stop Time 1313    PT Time Calculation (min) 39 min    Activity Tolerance Patient tolerated treatment well    Behavior During Therapy WFL for tasks assessed/performed                Past Medical History:  Diagnosis Date   Anxiety    Complication of anesthesia    slow to wake up   Elevated LFTs    Family history of adverse reaction to anesthesia    mom slow to wake up   Headache    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   History of kidney stones    Hx of migraines    Hyperlipidemia    hx of   Menses, irregular    Pineal gland cyst    Thyroid disease    hx abnormal thyroid levels   Past Surgical History:  Procedure Laterality Date   KIDNEY STONE SURGERY  2009   LUMBAR LAMINECTOMY/DECOMPRESSION MICRODISCECTOMY Left 08/03/2016   Procedure: Microlumbar decompression L5-S1 left;  Surgeon: Orvan Blanch, MD;  Location: [facility];  Service: Orthopedics;  Laterality: Left;   TUBAL LIGATION     WISDOM TOOTH EXTRACTION     Patient Active Problem List   Diagnosis Date Noted   HNP (herniated nucleus pulposus), lumbar 08/03/2016   Smoker 08/14/2013   Vitamin D  deficiency 08/14/2013   Subclinical hypothyroidism 08/14/2013   Pineal gland cyst    Hyperlipidemia    Thyroid disease    Menses, irregular    History of kidney stones    Elevated LFTs    Hx of migraines     PCP: Debra Hone, PA-C  REFERRING PROVIDER: Karlynn Oyster PA-C  REFERRING DIAG: right adhesive capsulitis  THERAPY DIAG:  Right shoulder pain, right shoulder stiffness, weakness  Rationale for Evaluation and Treatment: Rehabilitation  ONSET DATE: 6 months  SUBJECTIVE:                                                                                                                                                                                       SUBJECTIVE STATEMENT: Patient reports pain is 7-8/10. It feels more stiff because of the rain.  From Eval: Right shoulder started hurting 6 months ago for no reason  Doctor gave table ex's (  flexion, abduction), doorway and turn body wall stretch Hand dominance: Right  PERTINENT HISTORY: Menopause at age 82; non diabetic Back surgery 2017  PAIN:   Are you having pain? Yes NPRS scale: worse in the AM 10/10 Pain location: right shoulder, shoots to elbow Pain orientation: Right  PAIN TYPE: throbbing Pain description: constant  Aggravating factors: pain at night arm behind back, hard to comb hair and hook my bra; wiping myself after I go to the bathroom Relieving factors: heat, ice, aspirin  ; sitting   PRECAUTIONS: None    WEIGHT BEARING RESTRICTIONS: No  FALLS:  Has patient fallen in last 6 months? No   OCCUPATION: Light duty-computer work uses a mouse/keyboard; usually take boxes and trash out but someone else helps with that now  PLOF: Independent  PATIENT GOALS:be able to do my hair, wipe myself, put bra on, get dressed without pain   NEXT MD VISIT:   OBJECTIVE:  Note: Objective measures were completed at Evaluation unless otherwise noted.  DIAGNOSTIC FINDINGS:  MRI IMAGING: IMPRESSION: 1. Tendinosis of the rotator cuff without focal tear. 2. Thickening and increased signal intensity of the inferior capsule the axillary pouch with small joint effusion and proliferative tissue of the joint. This is nonspecific and may be related to previous injury of the inferior glenohumeral ligament or adhesive capsulitis. 3. Trace apparent reactive fluid of the subacromial subdeltoid bursa. 4. Degenerative labral changes with probable type I SLAP.   PATIENT SURVEYS:  Quick Dash 75% severe difficulty with  ADLs  COGNITION: Overall cognitive status: Within functional limits for tasks assessed      UPPER EXTREMITY ROM:  cervical ROM WFLs except mild limitation with extension and sidebending (not painful); painful with scapular retraction  Active ROM Right Eval (supine) 09/26/2023 Standing Left eval Right  supine  Shoulder flexion 95 95 158 106 supine  Shoulder extension      Shoulder abduction 76 65 152 Sidelying 90 degrees   Shoulder adduction      Shoulder internal rotation Right buttock   Radial styloid T8   Shoulder external rotation Ulnar styloid to ear 35  Ulnar styloid to C4;45   Elbow flexion WFLs  WFLs   Elbow extension 0  0   Wrist flexion      Wrist extension      Wrist ulnar deviation      Wrist radial deviation      Wrist pronation      Wrist supination      (Blank rows = not tested)  UPPER EXTREMITY MMT:  MMT Right eval Left eval  Shoulder flexion 3- 5  Shoulder extension 4 5  Shoulder abduction 3- 5  Shoulder adduction    Shoulder internal rotation 3 5  Shoulder external rotation 3 5  Middle trapezius 3 5  Lower trapezius 3 5  Elbow flexion 4 5  Elbow extension 4- 5  Wrist flexion    Wrist extension    Wrist ulnar deviation    Wrist radial deviation    Wrist pronation    Wrist supination    Grip strength (lbs) 35# 50#  (Blank rows = not tested)  JOINT MOBILITY TESTING:  Glenohumeral and scapular hypomobility; pain relief with glenohumeral distraction  PALPATION:  Tender points in upper traps  TREATMENT DATE:  10/05/2023 Moist heat concurrent with manual therapy Manual therapy: glenohumeral joint mobs: grade 3 , posterior and distractions, mobs with movement; scapular mobilization medial, lateral, superior and inferior; PROM flexion & abduction UE ranger on mat table (flexion & scaption) x 15 each direction Standing red  band bil rows 15x Small red stability ball roll ups at wall x 15 Standing counter weight pulleys 3#: flexion and abduction 12x each Seated blue stability ball rollouts (flexion & scaption) x 12 each direction  Trigger Point Dry Needling  Subsequent Treatment: Instructions provided previously at initial dry needling treatment.   Patient Verbal Consent Given: Yes Education Handout Provided: Previously Provided Muscles Treated: All on right: upper trap; supraspinatus; infraspinatus; rhomboids; teres Electrical Stimulation Performed: No Treatment Response/Outcome: Utilized skilled palpation to identify trigger points.  During dry needling able to palpate muscle twitch and muscle elongation       09/27/2023 Moist heat concurrent with manual therapy Manual therapy: glenohumeral joint mobs: grade 3 inferior, posterior and distractions, mobs with movement; scapular mobilization medial, lateral, superior and inferior; scapular dissociation Pendulum 3# swings 3 minutes UE Ranger on mat table 15x Railing pillow case slides 10x Standing red band bil rows 10x Seated blue ball rollouts for shoulder ROM 8x UE Ranger on wall 1/3 of the way elevation:  flexion 10x, scaption 10x  09/26/2023 Moist heat concurrent with manual therapy Manual therapy: glenohumeral joint mobs: grade 3 posterior and distractions, scapular mobilization medial, lateral, superior and inferior;  Shoulder pulley (flexion & abduction) x 2 mins each direction (verbal cue for thumb up with abduction) Standing counter weight pulleys 3#: flexion and abduction 12x each Ball roll ups with red stability ball x 12  Trigger Point Dry Needling  Subsequent Treatment: Instructions provided previously at initial dry needling treatment.   Patient Verbal Consent Given: Yes Education Handout Provided: Previously Provided Muscles Treated: Rt supraspinatus; right teres major/minor; right upper trap Electrical Stimulation Performed:  No Treatment Response/Outcome: Utilized skilled palpation to identify trigger points.  During dry needling able to palpate muscle twitch and muscle elongation  Soft tissue mobilization to muscles needled to further promote tissue elongation and decreased pain.       PATIENT EDUCATION: Education details: Educated patient on anatomy and physiology of current symptoms, prognosis, plan of care as well as initial self care strategies to promote recovery Person educated: Patient Education method: Explanation Education comprehension: verbalized understanding  HOME EXERCISE PROGRAM: Access Code: [email or web address] URL: [email or web address] Date: 09/07/2023 Prepared by: [name]  Exercises - Supine Shoulder Flexion PROM (Mirrored)  - 1 x daily - 7 x weekly - 1 sets - 3-5 reps - 30 hold - Standing Shoulder Inferior Glide with Towel Roll (Mirrored)  - 1 x daily - 7 x weekly - 1 sets - 10 reps - Circular Shoulder Pendulum with Table Support  - 1 x daily - 7 x weekly - 1 sets - 10 reps  ASSESSMENT:  CLINICAL IMPRESSION: Lux presents with lower pain levels compared to normal. She reports her arm have just been stiff due to the rainy weather. Dry needling was successful at eliciting muscle twitch elongation. Patient required verbal cues for scapular engagement with overhead movements. Patient will benefit from skilled PT to address the below impairments and improve overall function.     OBJECTIVE IMPAIRMENTS: decreased activity tolerance, decreased ROM, decreased strength, hypomobility, increased fascial restrictions, impaired perceived functional ability, impaired UE functional use, and pain.   ACTIVITY LIMITATIONS: carrying, lifting, sleeping, toileting, dressing, reach over  head, and hygiene/grooming  PARTICIPATION LIMITATIONS: meal prep, cleaning, laundry, and occupation  PERSONAL FACTORS: Time since onset of injury/illness/exacerbation are also affecting patient's functional  outcome.   REHAB POTENTIAL: Good  CLINICAL DECISION MAKING: Stable/uncomplicated  EVALUATION COMPLEXITY: Low   GOALS: Goals reviewed with patient? Yes  SHORT TERM GOALS: Target date: 10/05/2023   The patient will demonstrate knowledge of basic self care strategies and exercises to promote healing  Baseline: Goal status: INITIAL  2.  Improved right shoulder extension and internal rotation ROM needed for toileting/wiping Baseline:  Goal status: INITIAL  3.  Improved right shoulder flexion to 108 degrees needed for combing her hair Baseline:  Goal status: INITIAL  4.  Improved grip strength to 42# needed for carrying bags or boxes Baseline:  Goal status: INITIAL     LONG TERM GOALS: Target date: 11/02/2023    The patient will be independent in a safe self progression of a home exercise program to promote further recovery of function   Baseline:  Goal status: INITIAL  2.  The patient will have improved shoulder elevation ROM to at least 120 degrees needed for grooming/dressing purposes as well as reaching high shelves  Baseline:  Goal status: INITIAL  3.  The patient will have grossly 4-/5 strength needed to lift and lower a 2# object from a high shelf  Baseline:  Goal status: INITIAL  4.  The patient will report a 60% improvement in pain levels with functional activities which are currently difficult including sleeping, grooming and dressing tasks Baseline:  Goal status: INITIAL  5.  Quick DASH score improved to 62% indicating improved function with less pain Baseline:  Goal status: INITIAL   PLAN:  PT FREQUENCY: 1-2x/week  PT DURATION: 8 weeks  PLANNED INTERVENTIONS: 97164- PT Re-evaluation, 97110-Therapeutic exercises, 97530- Therapeutic activity, 97112- Neuromuscular re-education, 97535- Self Care, 16109- Manual therapy, [phone]- Aquatic Therapy, G0283- Electrical stimulation (unattended), [phone]- Electrical stimulation (manual), [phone]- Ionotophoresis  4mg /ml Dexamethasone , Patient/Family education, Taping, Dry Needling, Joint mobilization, Spinal mobilization, Cryotherapy, and Moist heat  PLAN FOR NEXT SESSION: assess DN; continue ROM;  glenohumeral and scapular joint mobilizations  [name], PT 10/05/23 1:22 PM

## 2023-10-06 ENCOUNTER — Ambulatory Visit: Admitting: Physical Therapy

## 2023-10-06 DIAGNOSIS — M25511 Pain in right shoulder: Secondary | ICD-10-CM | POA: Diagnosis not present

## 2023-10-06 DIAGNOSIS — M6281 Muscle weakness (generalized): Secondary | ICD-10-CM

## 2023-10-06 DIAGNOSIS — G8929 Other chronic pain: Secondary | ICD-10-CM

## 2023-10-06 DIAGNOSIS — M25611 Stiffness of right shoulder, not elsewhere classified: Secondary | ICD-10-CM

## 2023-10-06 NOTE — Therapy (Signed)
 OUTPATIENT PHYSICAL THERAPY SHOULDER TREATMENT   Patient Name: [name] MRN: [id number] DOB:1971/01/06, 53 y.o., female Today's Date: 10/06/2023  END OF SESSION:  PT End of Session - 10/06/23 0933     Visit Number 7    Date for PT Re-Evaluation 11/02/23    Authorization Type [employer] no auth req    PT Start Time 0930    PT Stop Time 1013    PT Time Calculation (min) 43 min    Activity Tolerance Patient tolerated treatment well                Past Medical History:  Diagnosis Date   Anxiety    Complication of anesthesia    slow to wake up   Elevated LFTs    Family history of adverse reaction to anesthesia    mom slow to wake up   Headache    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   History of kidney stones    Hx of migraines    Hyperlipidemia    hx of   Menses, irregular    Pineal gland cyst    Thyroid disease    hx abnormal thyroid levels   Past Surgical History:  Procedure Laterality Date   KIDNEY STONE SURGERY  2009   LUMBAR LAMINECTOMY/DECOMPRESSION MICRODISCECTOMY Left 08/03/2016   Procedure: Microlumbar decompression L5-S1 left;  Surgeon: Orvan Blanch, MD;  Location: [facility];  Service: Orthopedics;  Laterality: Left;   TUBAL LIGATION     WISDOM TOOTH EXTRACTION     Patient Active Problem List   Diagnosis Date Noted   HNP (herniated nucleus pulposus), lumbar 08/03/2016   Smoker 08/14/2013   Vitamin D  deficiency 08/14/2013   Subclinical hypothyroidism 08/14/2013   Pineal gland cyst    Hyperlipidemia    Thyroid disease    Menses, irregular    History of kidney stones    Elevated LFTs    Hx of migraines     PCP: Adeline Hone, PA-C  REFERRING PROVIDER: Karlynn Oyster PA-C  REFERRING DIAG: right adhesive capsulitis  THERAPY DIAG:  Right shoulder pain, right shoulder stiffness, weakness  Rationale for Evaluation and Treatment: Rehabilitation  ONSET DATE: 6 months  SUBJECTIVE:                                                                                                                                                                                       SUBJECTIVE STATEMENT: Denies soreness from DN last visit; back to work, by the end of the day I'm tired;  I take my heating pad to work and use at night  From Eval: Right shoulder started hurting 6 months ago for no  reason  Doctor gave table ex's (flexion, abduction), doorway and turn body wall stretch Hand dominance: Right  PERTINENT HISTORY: Menopause at age 89; non diabetic Back surgery 2017  PAIN:   Are you having pain? Yes NPRS scale: 7/10 Pain location: right shoulder, shoots to elbow Pain orientation: Right  PAIN TYPE: throbbing Pain description: constant  Aggravating factors: pain at night arm behind back, hard to comb hair and hook my bra; wiping myself after I go to the bathroom Relieving factors: heat, ice, aspirin  ; sitting   PRECAUTIONS: None    WEIGHT BEARING RESTRICTIONS: No  FALLS:  Has patient fallen in last 6 months? No   OCCUPATION: Light duty-computer work uses a mouse/keyboard; usually take boxes and trash out but someone else helps with that now  PLOF: Independent  PATIENT GOALS:be able to do my hair, wipe myself, put bra on, get dressed without pain   NEXT MD VISIT:   OBJECTIVE:  Note: Objective measures were completed at Evaluation unless otherwise noted.  DIAGNOSTIC FINDINGS:  MRI IMAGING: IMPRESSION: 1. Tendinosis of the rotator cuff without focal tear. 2. Thickening and increased signal intensity of the inferior capsule the axillary pouch with small joint effusion and proliferative tissue of the joint. This is nonspecific and may be related to previous injury of the inferior glenohumeral ligament or adhesive capsulitis. 3. Trace apparent reactive fluid of the subacromial subdeltoid bursa. 4. Degenerative labral changes with probable type I SLAP.   PATIENT SURVEYS:  Quick Dash 75% severe difficulty with  ADLs  COGNITION: Overall cognitive status: Within functional limits for tasks assessed      UPPER EXTREMITY ROM:  cervical ROM WFLs except mild limitation with extension and sidebending (not painful); painful with scapular retraction  Active ROM Right Eval (supine) 09/26/2023 Standing Left eval Right  supine Right 5/30  Shoulder flexion 95 95 158 106 supine 108 standing  Shoulder extension       Shoulder abduction 76 65 152 Sidelying 90 degrees    Shoulder adduction       Shoulder internal rotation Right buttock   Radial styloid T8    Shoulder external rotation Ulnar styloid to ear 35  Ulnar styloid to C4;45    Elbow flexion WFLs  WFLs    Elbow extension 0  0    Wrist flexion       Wrist extension       Wrist ulnar deviation       Wrist radial deviation       Wrist pronation       Wrist supination       (Blank rows = not tested)  UPPER EXTREMITY MMT:  MMT Right eval Left eval  Shoulder flexion 3- 5  Shoulder extension 4 5  Shoulder abduction 3- 5  Shoulder adduction    Shoulder internal rotation 3 5  Shoulder external rotation 3 5  Middle trapezius 3 5  Lower trapezius 3 5  Elbow flexion 4 5  Elbow extension 4- 5  Wrist flexion    Wrist extension    Wrist ulnar deviation    Wrist radial deviation    Wrist pronation    Wrist supination    Grip strength (lbs) 35# 50#  (Blank rows = not tested)  JOINT MOBILITY TESTING:  Glenohumeral and scapular hypomobility; pain relief with glenohumeral distraction  PALPATION:  Tender points in upper traps  TREATMENT DATE:  10/06/2023 Moist heat concurrent with manual therapy Manual therapy: glenohumeral joint mobs: grade 3 inferior, posterior and distractions, mobs with movement; scapular mobilization medial, lateral, superior and inferior; scapular dissociation Thoracic extension seated with towel  roll 10x Pendulum 5# swings 3 minutes In the doorway holding doorframe with neck rotation left 10x Hands on high mat table while taking steps backward to stretch right shoulder 10x UE ranger on 1st, 2nd and 3rd steps 10x each Isometric hold yellow band with side stepping 3 steps: internal and external rotation 5x each Seated bear hug with yellow band 10x  Standing biceps red band 10x Standing triceps red band 10x Walk to other gym with focus on arm swing UE Ranger on wall 1/3 of the way elevation:  flexion 10x, 1/4 way scaption 10x  10/05/2023 Moist heat concurrent with manual therapy Manual therapy: glenohumeral joint mobs: grade 3 , posterior and distractions, mobs with movement; scapular mobilization medial, lateral, superior and inferior; PROM flexion & abduction UE ranger on mat table (flexion & scaption) x 15 each direction Standing red band bil rows 15x Small red stability ball roll ups at wall x 15 Standing counter weight pulleys 3#: flexion and abduction 12x each Seated blue stability ball rollouts (flexion & scaption) x 12 each direction  Trigger Point Dry Needling  Subsequent Treatment: Instructions provided previously at initial dry needling treatment.   Patient Verbal Consent Given: Yes Education Handout Provided: Previously Provided Muscles Treated: All on right: upper trap; supraspinatus; infraspinatus; rhomboids; teres Electrical Stimulation Performed: No Treatment Response/Outcome: Utilized skilled palpation to identify trigger points.  During dry needling able to palpate muscle twitch and muscle elongation       09/27/2023 Moist heat concurrent with manual therapy Manual therapy: glenohumeral joint mobs: grade 3 inferior, posterior and distractions, mobs with movement; scapular mobilization medial, lateral, superior and inferior; scapular dissociation Pendulum 3# swings 3 minutes UE Ranger on mat table 15x Railing pillow case slides 10x Standing red band bil  rows 10x Seated blue ball rollouts for shoulder ROM 8x UE Ranger on wall 1/3 of the way elevation:  flexion 10x, scaption 10x    PATIENT EDUCATION: Education details: Educated patient on anatomy and physiology of current symptoms, prognosis, plan of care as well as initial self care strategies to promote recovery Person educated: Patient Education method: Explanation Education comprehension: verbalized understanding  HOME EXERCISE PROGRAM: Access Code: [email or web address] URL: [email or web address] Date: 09/07/2023 Prepared by: Darien Eden  Exercises - Supine Shoulder Flexion PROM (Mirrored)  - 1 x daily - 7 x weekly - 1 sets - 3-5 reps - 30 hold - Standing Shoulder Inferior Glide with Towel Roll (Mirrored)  - 1 x daily - 7 x weekly - 1 sets - 10 reps - Circular Shoulder Pendulum with Table Support  - 1 x daily - 7 x weekly - 1 sets - 10 reps  ASSESSMENT:  CLINICAL IMPRESSION: Improving glenohumeral and scapular mobility noted.  Verbal and tactile cues for proper recruitment of middle and lower trap muscles and to avoid compensatory shoulder hike. Decreasing pain sensitivity overall.  Progressing with rehab goals.      OBJECTIVE IMPAIRMENTS: decreased activity tolerance, decreased ROM, decreased strength, hypomobility, increased fascial restrictions, impaired perceived functional ability, impaired UE functional use, and pain.   ACTIVITY LIMITATIONS: carrying, lifting, sleeping, toileting, dressing, reach over head, and hygiene/grooming  PARTICIPATION LIMITATIONS: meal prep, cleaning, laundry, and occupation  PERSONAL FACTORS: Time since onset of injury/illness/exacerbation are also affecting patient's functional outcome.  REHAB POTENTIAL: Good  CLINICAL DECISION MAKING: Stable/uncomplicated  EVALUATION COMPLEXITY: Low   GOALS: Goals reviewed with patient? Yes  SHORT TERM GOALS: Target date: 10/05/2023   The patient will demonstrate knowledge of basic self care  strategies and exercises to promote healing  Baseline: Goal status: met 5/30 2.  Improved right shoulder extension and internal rotation ROM needed for toileting/wiping Baseline:  Goal status: ongoing  3.  Improved right shoulder flexion to 108 degrees needed for combing her hair Baseline:  Goal status: met 5/30  4.  Improved grip strength to 42# needed for carrying bags or boxes Baseline:  Goal status: INITIAL     LONG TERM GOALS: Target date: 11/02/2023    The patient will be independent in a safe self progression of a home exercise program to promote further recovery of function   Baseline:  Goal status: INITIAL  2.  The patient will have improved shoulder elevation ROM to at least 120 degrees needed for grooming/dressing purposes as well as reaching high shelves  Baseline:  Goal status: INITIAL  3.  The patient will have grossly 4-/5 strength needed to lift and lower a 2# object from a high shelf  Baseline:  Goal status: INITIAL  4.  The patient will report a 60% improvement in pain levels with functional activities which are currently difficult including sleeping, grooming and dressing tasks Baseline:  Goal status: INITIAL  5.  Quick DASH score improved to 62% indicating improved function with less pain Baseline:  Goal status: INITIAL   PLAN:  PT FREQUENCY: 1-2x/week  PT DURATION: 8 weeks  PLANNED INTERVENTIONS: 97164- PT Re-evaluation, 97110-Therapeutic exercises, 97530- Therapeutic activity, 97112- Neuromuscular re-education, 97535- Self Care, 95621- Manual therapy, [phone]- Aquatic Therapy, G0283- Electrical stimulation (unattended), [phone]- Electrical stimulation (manual), [phone]- Ionotophoresis 4mg /ml Dexamethasone , Patient/Family education, Taping, Dry Needling, Joint mobilization, Spinal mobilization, Cryotherapy, and Moist heat  PLAN FOR NEXT SESSION: check grip strength and remaining STGs;  assess DN; continue ROM;  glenohumeral and scapular joint  mobilizations  Darien Eden, PT 10/06/23 12:04 PM Phone: [phone] Fax: [phone]

## 2023-10-11 ENCOUNTER — Ambulatory Visit: Attending: Physician Assistant | Admitting: Physical Therapy

## 2023-10-11 DIAGNOSIS — M25611 Stiffness of right shoulder, not elsewhere classified: Secondary | ICD-10-CM | POA: Insufficient documentation

## 2023-10-11 DIAGNOSIS — G8929 Other chronic pain: Secondary | ICD-10-CM | POA: Diagnosis present

## 2023-10-11 DIAGNOSIS — M25511 Pain in right shoulder: Secondary | ICD-10-CM | POA: Diagnosis present

## 2023-10-11 DIAGNOSIS — M6281 Muscle weakness (generalized): Secondary | ICD-10-CM | POA: Insufficient documentation

## 2023-10-11 DIAGNOSIS — M25512 Pain in left shoulder: Secondary | ICD-10-CM | POA: Diagnosis present

## 2023-10-11 NOTE — Therapy (Signed)
 OUTPATIENT PHYSICAL THERAPY SHOULDER TREATMENT   Patient Name: [name] MRN: [id number] DOB:December 12, 1970, 53 y.o., female Today's Date: 10/11/2023  END OF SESSION:  PT End of Session - 10/11/23 1148     Visit Number 8    Date for PT Re-Evaluation 11/02/23    Authorization Type [employer] no auth req    PT Start Time 1146    PT Stop Time 1230    PT Time Calculation (min) 44 min                Past Medical History:  Diagnosis Date   Anxiety    Complication of anesthesia    slow to wake up   Elevated LFTs    Family history of adverse reaction to anesthesia    mom slow to wake up   Headache    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   History of kidney stones    Hx of migraines    Hyperlipidemia    hx of   Menses, irregular    Pineal gland cyst    Thyroid disease    hx abnormal thyroid levels   Past Surgical History:  Procedure Laterality Date   KIDNEY STONE SURGERY  2009   LUMBAR LAMINECTOMY/DECOMPRESSION MICRODISCECTOMY Left 08/03/2016   Procedure: Microlumbar decompression L5-S1 left;  Surgeon: Orvan Blanch, MD;  Location: [facility];  Service: Orthopedics;  Laterality: Left;   TUBAL LIGATION     WISDOM TOOTH EXTRACTION     Patient Active Problem List   Diagnosis Date Noted   HNP (herniated nucleus pulposus), lumbar 08/03/2016   Smoker 08/14/2013   Vitamin D  deficiency 08/14/2013   Subclinical hypothyroidism 08/14/2013   Pineal gland cyst    Hyperlipidemia    Thyroid disease    Menses, irregular    History of kidney stones    Elevated LFTs    Hx of migraines     PCP: Adeline Hone, PA-C  REFERRING PROVIDER: Karlynn Oyster PA-C  REFERRING DIAG: right adhesive capsulitis  THERAPY DIAG:  Right shoulder pain, right shoulder stiffness, weakness  Rationale for Evaluation and Treatment: Rehabilitation  ONSET DATE: 6 months  SUBJECTIVE:                                                                                                                                                                                       SUBJECTIVE STATEMENT: Going to see the doctor soon, may ask about getting a steroid shot along with PT.     From Eval: Right shoulder started hurting 6 months ago for no reason  Doctor gave table ex's (flexion, abduction), doorway and turn body wall stretch Hand dominance: Right  PERTINENT  HISTORY: Menopause at age 67; non diabetic Back surgery 2017  PAIN:   Are you having pain? Yes NPRS scale: 6/10 Pain location: right shoulder, shoots to elbow Pain orientation: Right  PAIN TYPE: throbbing Pain description: constant  Aggravating factors: pain at night arm behind back, hard to comb hair and hook my bra; wiping myself after I go to the bathroom Relieving factors: heat, ice, aspirin  ; sitting   PRECAUTIONS: None    WEIGHT BEARING RESTRICTIONS: No  FALLS:  Has patient fallen in last 6 months? No   OCCUPATION: Light duty-computer work uses a mouse/keyboard; usually take boxes and trash out but someone else helps with that now  PLOF: Independent  PATIENT GOALS:be able to do my hair, wipe myself, put bra on, get dressed without pain   NEXT MD VISIT:   OBJECTIVE:  Note: Objective measures were completed at Evaluation unless otherwise noted.  DIAGNOSTIC FINDINGS:  MRI IMAGING: IMPRESSION: 1. Tendinosis of the rotator cuff without focal tear. 2. Thickening and increased signal intensity of the inferior capsule the axillary pouch with small joint effusion and proliferative tissue of the joint. This is nonspecific and may be related to previous injury of the inferior glenohumeral ligament or adhesive capsulitis. 3. Trace apparent reactive fluid of the subacromial subdeltoid bursa. 4. Degenerative labral changes with probable type I SLAP.   PATIENT SURVEYS:  Quick Dash 75% severe difficulty with ADLs  COGNITION: Overall cognitive status: Within functional limits for tasks assessed      UPPER EXTREMITY ROM:   cervical ROM WFLs except mild limitation with extension and sidebending (not painful); painful with scapular retraction  Active ROM Right Eval (supine) 09/26/2023 Standing Left eval Right  supine Right 5/30 6/4  Shoulder flexion 95 95 158 106 supine 108 standing 103 seated  Shoulder extension        Shoulder abduction 76 65 152 Sidelying 90 degrees   99  Shoulder adduction        Shoulder internal rotation Right buttock   Radial styloid T8   Right buttock  Shoulder external rotation Ulnar styloid to ear 35  Ulnar styloid to C4;45   Ulnar styloid to ear  40 degrees  Elbow flexion WFLs  WFLs     Elbow extension 0  0     Wrist flexion        Wrist extension        Wrist ulnar deviation        Wrist radial deviation        Wrist pronation        Wrist supination        (Blank rows = not tested)  UPPER EXTREMITY MMT:  MMT Right eval Left eval Right 6/4  Shoulder flexion 3- 5   Shoulder extension 4 5   Shoulder abduction 3- 5   Shoulder adduction     Shoulder internal rotation 3 5   Shoulder external rotation 3 5   Middle trapezius 3 5   Lower trapezius 3 5   Elbow flexion 4 5   Elbow extension 4- 5   Wrist flexion     Wrist extension     Wrist ulnar deviation     Wrist radial deviation     Wrist pronation     Wrist supination     Grip strength (lbs) 35# 50# 44#  (Blank rows = not tested)  JOINT MOBILITY TESTING:  Glenohumeral and scapular hypomobility; pain relief with glenohumeral distraction  PALPATION:  Tender points in  upper traps                                                                                                                             TREATMENT DATE:  10/11/2023 Discussed possible injection along with PT Moist heat concurrent with manual therapy Manual therapy: glenohumeral joint mobs: grade 3 inferior, posterior and distractions, mobs with movement; scapular mobilization medial, lateral, superior and inferior; scapular dissociation Internal  rotation with hands on counter top 10x Holding counter stepping backward for shoulder stretch 10x Pendulum 4# swings 3 minutes Standing blue ball roll on mat table 20x Prone pendulums 3 sets of 5 Prone shoulder extension to hip 2 sets of 5 Prone beach ball roll for flexion 2 sets of 5 Prone horizontal abduction small range 2 sets of 5 Wall slides up the wall 10x Hand on wall turning body for pectoral stretch 10x 10/06/2023 Moist heat concurrent with manual therapy Manual therapy: glenohumeral joint mobs: grade 3 inferior, posterior and distractions, mobs with movement; scapular mobilization medial, lateral, superior and inferior; scapular dissociation Thoracic extension seated with towel roll 10x Pendulum 5# swings 3 minutes In the doorway holding doorframe with neck rotation left 10x Hands on high mat table while taking steps backward to stretch right shoulder 10x UE ranger on 1st, 2nd and 3rd steps 10x each Isometric hold yellow band with side stepping 3 steps: internal and external rotation 5x each Seated bear hug with yellow band 10x  Standing biceps red band 10x Standing triceps red band 10x Walk to other gym with focus on arm swing UE Ranger on wall 1/3 of the way elevation:  flexion 10x, 1/4 way scaption 10x  10/05/2023 Moist heat concurrent with manual therapy Manual therapy: glenohumeral joint mobs: grade 3 , posterior and distractions, mobs with movement; scapular mobilization medial, lateral, superior and inferior; PROM flexion & abduction UE ranger on mat table (flexion & scaption) x 15 each direction Standing red band bil rows 15x Small red stability ball roll ups at wall x 15 Standing counter weight pulleys 3#: flexion and abduction 12x each Seated blue stability ball rollouts (flexion & scaption) x 12 each direction  Trigger Point Dry Needling  Subsequent Treatment: Instructions provided previously at initial dry needling treatment.   Patient Verbal Consent Given:  Yes Education Handout Provided: Previously Provided Muscles Treated: All on right: upper trap; supraspinatus; infraspinatus; rhomboids; teres Electrical Stimulation Performed: No Treatment Response/Outcome: Utilized skilled palpation to identify trigger points.  During dry needling able to palpate muscle twitch and muscle elongation         PATIENT EDUCATION: Education details: Educated patient on anatomy and physiology of current symptoms, prognosis, plan of care as well as initial self care strategies to promote recovery Person educated: Patient Education method: Explanation Education comprehension: verbalized understanding  HOME EXERCISE PROGRAM: Access Code: [email or web address] URL: [email or web address] Date: 09/07/2023 Prepared by: Darien Eden  Exercises - Supine Shoulder Flexion PROM (Mirrored)  - 1 x daily -  7 x weekly - 1 sets - 3-5 reps - 30 hold - Standing Shoulder Inferior Glide with Towel Roll (Mirrored)  - 1 x daily - 7 x weekly - 1 sets - 10 reps - Circular Shoulder Pendulum with Table Support  - 1 x daily - 7 x weekly - 1 sets - 10 reps  ASSESSMENT:  CLINICAL IMPRESSION: Improvements in right shoulder flexion and abduction ROM, although no change in internal and external rotation which affects personal care.  Grip strength significantly improved since start of care.  Pain is a factor with progression of exercise.  Verbal and tactile cues for proper recruitment of middle and lower trap muscles and to avoid compensatory shoulder hike.      OBJECTIVE IMPAIRMENTS: decreased activity tolerance, decreased ROM, decreased strength, hypomobility, increased fascial restrictions, impaired perceived functional ability, impaired UE functional use, and pain.   ACTIVITY LIMITATIONS: carrying, lifting, sleeping, toileting, dressing, reach over head, and hygiene/grooming  PARTICIPATION LIMITATIONS: meal prep, cleaning, laundry, and occupation  PERSONAL FACTORS: Time  since onset of injury/illness/exacerbation are also affecting patient's functional outcome.   REHAB POTENTIAL: Good  CLINICAL DECISION MAKING: Stable/uncomplicated  EVALUATION COMPLEXITY: Low   GOALS: Goals reviewed with patient? Yes  SHORT TERM GOALS: Target date: 10/05/2023   The patient will demonstrate knowledge of basic self care strategies and exercises to promote healing  Baseline: Goal status: met 5/30 2.  Improved right shoulder extension and internal rotation ROM needed for toileting/wiping Baseline:  Goal status: ongoing  3.  Improved right shoulder flexion to 108 degrees needed for combing her hair Baseline:  Goal status: met 5/30  4.  Improved grip strength to 42# needed for carrying bags or boxes Baseline:  Goal status: met 6/4    LONG TERM GOALS: Target date: 11/02/2023    The patient will be independent in a safe self progression of a home exercise program to promote further recovery of function   Baseline:  Goal status: INITIAL  2.  The patient will have improved shoulder elevation ROM to at least 120 degrees needed for grooming/dressing purposes as well as reaching high shelves  Baseline:  Goal status: INITIAL  3.  The patient will have grossly 4-/5 strength needed to lift and lower a 2# object from a high shelf  Baseline:  Goal status: INITIAL  4.  The patient will report a 60% improvement in pain levels with functional activities which are currently difficult including sleeping, grooming and dressing tasks Baseline:  Goal status: INITIAL  5.  Quick DASH score improved to 62% indicating improved function with less pain Baseline:  Goal status: INITIAL   PLAN:  PT FREQUENCY: 1-2x/week  PT DURATION: 8 weeks  PLANNED INTERVENTIONS: 97164- PT Re-evaluation, 97110-Therapeutic exercises, 97530- Therapeutic activity, 97112- Neuromuscular re-education, 97535- Self Care, 16109- Manual therapy, [phone]- Aquatic Therapy, G0283- Electrical  stimulation (unattended), [phone]- Electrical stimulation (manual), [phone]- Ionotophoresis 4mg /ml Dexamethasone , Patient/Family education, Taping, Dry Needling, Joint mobilization, Spinal mobilization, Cryotherapy, and Moist heat  PLAN FOR NEXT SESSION:   continue ROM and strength of right shoulder;  glenohumeral and scapular joint mobilizations; dec frequency to 1x/week  Darien Eden, PT 10/11/23 1:56 PM Phone: [phone] Fax: [phone]

## 2023-10-19 ENCOUNTER — Ambulatory Visit: Admitting: Physical Therapy

## 2023-10-19 DIAGNOSIS — M25611 Stiffness of right shoulder, not elsewhere classified: Secondary | ICD-10-CM

## 2023-10-19 DIAGNOSIS — G8929 Other chronic pain: Secondary | ICD-10-CM

## 2023-10-19 DIAGNOSIS — M25511 Pain in right shoulder: Secondary | ICD-10-CM | POA: Diagnosis not present

## 2023-10-19 DIAGNOSIS — M6281 Muscle weakness (generalized): Secondary | ICD-10-CM

## 2023-10-19 NOTE — Patient Instructions (Signed)

## 2023-10-19 NOTE — Therapy (Signed)
 OUTPATIENT PHYSICAL THERAPY SHOULDER TREATMENT   Patient Name: [name] MRN: [id number] DOB:1971/03/29, 53 y.o., female Today's Date: 10/19/2023  END OF SESSION:  PT End of Session - 10/19/23 1239     Visit Number 9    Date for PT Re-Evaluation 11/02/23    Authorization Type [employer] no auth req    PT Start Time 1234    PT Stop Time 1315    PT Time Calculation (min) 41 min    Activity Tolerance Patient tolerated treatment well             Past Medical History:  Diagnosis Date   Anxiety    Complication of anesthesia    slow to wake up   Elevated LFTs    Family history of adverse reaction to anesthesia    mom slow to wake up   Headache    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   History of kidney stones    Hx of migraines    Hyperlipidemia    hx of   Menses, irregular    Pineal gland cyst    Thyroid disease    hx abnormal thyroid levels   Past Surgical History:  Procedure Laterality Date   KIDNEY STONE SURGERY  2009   LUMBAR LAMINECTOMY/DECOMPRESSION MICRODISCECTOMY Left 08/03/2016   Procedure: Microlumbar decompression L5-S1 left;  Surgeon: Orvan Blanch, MD;  Location: [facility];  Service: Orthopedics;  Laterality: Left;   TUBAL LIGATION     WISDOM TOOTH EXTRACTION     Patient Active Problem List   Diagnosis Date Noted   HNP (herniated nucleus pulposus), lumbar 08/03/2016   Smoker 08/14/2013   Vitamin D  deficiency 08/14/2013   Subclinical hypothyroidism 08/14/2013   Pineal gland cyst    Hyperlipidemia    Thyroid disease    Menses, irregular    History of kidney stones    Elevated LFTs    Hx of migraines     PCP: Adeline Hone, PA-C  REFERRING PROVIDER: Karlynn Oyster PA-C  REFERRING DIAG: right adhesive capsulitis  THERAPY DIAG:  Right shoulder pain, right shoulder stiffness, weakness  Rationale for Evaluation and Treatment: Rehabilitation  ONSET DATE: 6 months  SUBJECTIVE:                                                                                                                                                                                       SUBJECTIVE STATEMENT: Saw the dr. Injection in July 11th.  I'm going to see if I can get one sooner.  The pain in my right shoulder is bad 10/10.  My left shoulder hurts too.  I think I'm overdoing it with exercises at home.  From Eval: Right shoulder started hurting 6 months ago for no reason  Doctor gave table ex's (flexion, abduction), doorway and turn body wall stretch Hand dominance: Right  PERTINENT HISTORY: Menopause at age 22; non diabetic Back surgery 2017  PAIN:   Are you having pain? Yes NPRS scale: 10/10 Pain location: right shoulder, shoots to elbow Pain orientation: Right  PAIN TYPE: throbbing Pain description: constant  Aggravating factors: pain at night arm behind back, hard to comb hair and hook my bra; wiping myself after I go to the bathroom Relieving factors: heat, ice, aspirin  ; sitting   PRECAUTIONS: None    WEIGHT BEARING RESTRICTIONS: No  FALLS:  Has patient fallen in last 6 months? No   OCCUPATION: Light duty-computer work uses a mouse/keyboard; usually take boxes and trash out but someone else helps with that now  PLOF: Independent  PATIENT GOALS:be able to do my hair, wipe myself, put bra on, get dressed without pain   NEXT MD VISIT:   OBJECTIVE:  Note: Objective measures were completed at Evaluation unless otherwise noted.  DIAGNOSTIC FINDINGS:  MRI IMAGING: IMPRESSION: 1. Tendinosis of the rotator cuff without focal tear. 2. Thickening and increased signal intensity of the inferior capsule the axillary pouch with small joint effusion and proliferative tissue of the joint. This is nonspecific and may be related to previous injury of the inferior glenohumeral ligament or adhesive capsulitis. 3. Trace apparent reactive fluid of the subacromial subdeltoid bursa. 4. Degenerative labral changes with probable type I SLAP.   PATIENT  SURVEYS:  Quick Dash 75% severe difficulty with ADLs  COGNITION: Overall cognitive status: Within functional limits for tasks assessed      UPPER EXTREMITY ROM:  cervical ROM WFLs except mild limitation with extension and sidebending (not painful); painful with scapular retraction  Active ROM Right Eval (supine) 09/26/2023 Standing Left eval Right  supine Right 5/30 6/4  Shoulder flexion 95 95 158 106 supine 108 standing 103 seated  Shoulder extension        Shoulder abduction 76 65 152 Sidelying 90 degrees   99  Shoulder adduction        Shoulder internal rotation Right buttock   Radial styloid T8   Right buttock  Shoulder external rotation Ulnar styloid to ear 35  Ulnar styloid to C4;45   Ulnar styloid to ear  40 degrees  Elbow flexion WFLs  WFLs     Elbow extension 0  0     Wrist flexion        Wrist extension        Wrist ulnar deviation        Wrist radial deviation        Wrist pronation        Wrist supination        (Blank rows = not tested)  UPPER EXTREMITY MMT:  MMT Right eval Left eval Right 6/4  Shoulder flexion 3- 5   Shoulder extension 4 5   Shoulder abduction 3- 5   Shoulder adduction     Shoulder internal rotation 3 5   Shoulder external rotation 3 5   Middle trapezius 3 5   Lower trapezius 3 5   Elbow flexion 4 5   Elbow extension 4- 5   Wrist flexion     Wrist extension     Wrist ulnar deviation     Wrist radial deviation     Wrist pronation     Wrist supination     Grip strength (  lbs) 35# 50# 44#  (Blank rows = not tested)  JOINT MOBILITY TESTING:  Glenohumeral and scapular hypomobility; pain relief with glenohumeral distraction  PALPATION:  Tender points in upper traps                                                                                                                             TREATMENT DATE:  10/19/2023 Moist heat concurrent with manual therapy Manual therapy: glenohumeral joint mobs: grade 3 inferior, posterior and  distractions, mobs with movement; scapular mobilization medial, lateral, superior and inferior; scapular dissociation Holding counter stepping backward for shoulder stretch 10x Pendulum 4# swings 3 minutes Discussion of exercises for home that are low irritability: standing and Prone pendulums, counter L walk aways (pick 2-3 ex's at a time) KT to both shoulders: 2 vertical strips anterior and posterior deltoids and 1 horizontal strip across distal deltoids instruction in 2-3 day wear time Iontophoresis dexamethasone  patch to right subacromial region 4 hour wear time (see pt instruction handout)  10/11/2023 Discussed possible injection along with PT Moist heat concurrent with manual therapy Manual therapy: glenohumeral joint mobs: grade 3 inferior, posterior and distractions, mobs with movement; scapular mobilization medial, lateral, superior and inferior; scapular dissociation Internal rotation with hands on counter top 10x Holding counter stepping backward for shoulder stretch 10x Pendulum 4# swings 3 minutes Standing blue ball roll on mat table 20x Prone pendulums 3 sets of 5 Prone shoulder extension to hip 2 sets of 5 Prone beach ball roll for flexion 2 sets of 5 Prone horizontal abduction small range 2 sets of 5 Wall slides up the wall 10x Hand on wall turning body for pectoral stretch 10x 10/06/2023 Moist heat concurrent with manual therapy Manual therapy: glenohumeral joint mobs: grade 3 inferior, posterior and distractions, mobs with movement; scapular mobilization medial, lateral, superior and inferior; scapular dissociation Thoracic extension seated with towel roll 10x Pendulum 5# swings 3 minutes In the doorway holding doorframe with neck rotation left 10x Hands on high mat table while taking steps backward to stretch right shoulder 10x UE ranger on 1st, 2nd and 3rd steps 10x each Isometric hold yellow band with side stepping 3 steps: internal and external rotation 5x each Seated  bear hug with yellow band 10x  Standing biceps red band 10x Standing triceps red band 10x Walk to other gym with focus on arm swing UE Ranger on wall 1/3 of the way elevation:  flexion 10x, 1/4 way scaption 10x  10/05/2023 Moist heat concurrent with manual therapy Manual therapy: glenohumeral joint mobs: grade 3 , posterior and distractions, mobs with movement; scapular mobilization medial, lateral, superior and inferior; PROM flexion & abduction UE ranger on mat table (flexion & scaption) x 15 each direction Standing red band bil rows 15x Small red stability ball roll ups at wall x 15 Standing counter weight pulleys 3#: flexion and abduction 12x each Seated blue stability ball rollouts (flexion & scaption) x 12 each direction  Trigger  Point Dry Needling  Subsequent Treatment: Instructions provided previously at initial dry needling treatment.   Patient Verbal Consent Given: Yes Education Handout Provided: Previously Provided Muscles Treated: All on right: upper trap; supraspinatus; infraspinatus; rhomboids; teres Electrical Stimulation Performed: No Treatment Response/Outcome: Utilized skilled palpation to identify trigger points.  During dry needling able to palpate muscle twitch and muscle elongation         PATIENT EDUCATION: Education details: Educated patient on anatomy and physiology of current symptoms, prognosis, plan of care as well as initial self care strategies to promote recovery Person educated: Patient Education method: Explanation Education comprehension: verbalized understanding  HOME EXERCISE PROGRAM: Access Code: [email or web address] URL: [email or web address] Date: 09/07/2023 Prepared by: Darien Eden  Exercises - Supine Shoulder Flexion PROM (Mirrored)  - 1 x daily - 7 x weekly - 1 sets - 3-5 reps - 30 hold - Standing Shoulder Inferior Glide with Towel Roll (Mirrored)  - 1 x daily - 7 x weekly - 1 sets - 10 reps - Circular Shoulder Pendulum with  Table Support  - 1 x daily - 7 x weekly - 1 sets - 10 reps  ASSESSMENT:  CLINICAL IMPRESSION: Patient arrives with report of 10 out of 10 pain right shoulder (but starting in her other shoulder too).  Treatment modified accordingly including manual therapy joint mobs and kinesiotaping.  Trial of iontophoresis as well since her injection is unable to be done until July.  She responds well to these interventions.  Discussed home exercises, just 2-3 exercises at a time, but to try to continue with ROM on a regular basis.  She reports pain relief post session.     OBJECTIVE IMPAIRMENTS: decreased activity tolerance, decreased ROM, decreased strength, hypomobility, increased fascial restrictions, impaired perceived functional ability, impaired UE functional use, and pain.   ACTIVITY LIMITATIONS: carrying, lifting, sleeping, toileting, dressing, reach over head, and hygiene/grooming  PARTICIPATION LIMITATIONS: meal prep, cleaning, laundry, and occupation  PERSONAL FACTORS: Time since onset of injury/illness/exacerbation are also affecting patient's functional outcome.   REHAB POTENTIAL: Good  CLINICAL DECISION MAKING: Stable/uncomplicated  EVALUATION COMPLEXITY: Low   GOALS: Goals reviewed with patient? Yes  SHORT TERM GOALS: Target date: 10/05/2023   The patient will demonstrate knowledge of basic self care strategies and exercises to promote healing  Baseline: Goal status: met 5/30 2.  Improved right shoulder extension and internal rotation ROM needed for toileting/wiping Baseline:  Goal status: ongoing  3.  Improved right shoulder flexion to 108 degrees needed for combing her hair Baseline:  Goal status: met 5/30  4.  Improved grip strength to 42# needed for carrying bags or boxes Baseline:  Goal status: met 6/4    LONG TERM GOALS: Target date: 11/02/2023    The patient will be independent in a safe self progression of a home exercise program to promote further recovery  of function   Baseline:  Goal status: INITIAL  2.  The patient will have improved shoulder elevation ROM to at least 120 degrees needed for grooming/dressing purposes as well as reaching high shelves  Baseline:  Goal status: INITIAL  3.  The patient will have grossly 4-/5 strength needed to lift and lower a 2# object from a high shelf  Baseline:  Goal status: INITIAL  4.  The patient will report a 60% improvement in pain levels with functional activities which are currently difficult including sleeping, grooming and dressing tasks Baseline:  Goal status: INITIAL  5.  Quick DASH score improved to 62% indicating improved function  with less pain Baseline:  Goal status: INITIAL   PLAN:  PT FREQUENCY: 1-2x/week  PT DURATION: 8 weeks  PLANNED INTERVENTIONS: 97164- PT Re-evaluation, 97110-Therapeutic exercises, 97530- Therapeutic activity, 97112- Neuromuscular re-education, 97535- Self Care, 16109- Manual therapy, [phone]- Aquatic Therapy, [phone]- Electrical stimulation (unattended), [phone]- Electrical stimulation (manual), [phone]- Ionotophoresis 4mg /ml Dexamethasone , Patient/Family education, Taping, Dry Needling, Joint mobilization, Spinal mobilization, Cryotherapy, and Moist heat  PLAN FOR NEXT SESSION:   assess response to ionto and KT; continue ROM and strength of right shoulder;  glenohumeral and scapular joint mobilizations; dec frequency to 1x/week  Darien Eden, PT 10/19/23 5:53 PM Phone: [phone] Fax: [phone]

## 2023-10-24 ENCOUNTER — Encounter: Payer: Self-pay | Admitting: Physical Therapy

## 2023-10-24 ENCOUNTER — Ambulatory Visit: Admitting: Physical Therapy

## 2023-10-24 DIAGNOSIS — M25511 Pain in right shoulder: Secondary | ICD-10-CM | POA: Diagnosis not present

## 2023-10-24 DIAGNOSIS — G8929 Other chronic pain: Secondary | ICD-10-CM

## 2023-10-24 DIAGNOSIS — M6281 Muscle weakness (generalized): Secondary | ICD-10-CM

## 2023-10-24 DIAGNOSIS — M25611 Stiffness of right shoulder, not elsewhere classified: Secondary | ICD-10-CM

## 2023-10-24 NOTE — Therapy (Signed)
 OUTPATIENT PHYSICAL THERAPY SHOULDER TREATMENT   Patient Name: Christalynn Boise MRN: [id number] DOB:[birth year], 53 y.o., female Today's Date: 10/24/2023  END OF SESSION:  PT End of Session - 10/24/23 1359     Visit Number 10    Date for PT Re-Evaluation 11/02/23    Authorization Type [employer] no auth req    PT Start Time 1232    PT Stop Time 1312    PT Time Calculation (min) 40 min    Activity Tolerance Patient tolerated treatment well    Behavior During Therapy WFL for tasks assessed/performed              Past Medical History:  Diagnosis Date   Anxiety    Complication of anesthesia    slow to wake up   Elevated LFTs    Family history of adverse reaction to anesthesia    mom slow to wake up   Headache    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   History of kidney stones    Hx of migraines    Hyperlipidemia    hx of   Menses, irregular    Pineal gland cyst    Thyroid disease    hx abnormal thyroid levels   Past Surgical History:  Procedure Laterality Date   KIDNEY STONE SURGERY  2009   LUMBAR LAMINECTOMY/DECOMPRESSION MICRODISCECTOMY Left 08/03/2016   Procedure: Microlumbar decompression L5-S1 left;  Surgeon: Orvan Blanch, MD;  Location: [facility];  Service: Orthopedics;  Laterality: Left;   TUBAL LIGATION     WISDOM TOOTH EXTRACTION     Patient Active Problem List   Diagnosis Date Noted   HNP (herniated nucleus pulposus), lumbar 08/03/2016   Smoker 08/14/2013   Vitamin D  deficiency 08/14/2013   Subclinical hypothyroidism 08/14/2013   Pineal gland cyst    Hyperlipidemia    Thyroid disease    Menses, irregular    History of kidney stones    Elevated LFTs    Hx of migraines     PCP: Adeline Hone, PA-C  REFERRING PROVIDER: Karlynn Oyster PA-C  REFERRING DIAG: right adhesive capsulitis  THERAPY DIAG:  Right shoulder pain, right shoulder stiffness, weakness  Rationale for Evaluation and Treatment: Rehabilitation  ONSET DATE: 6 months  SUBJECTIVE:                                                                                                                                                                                       SUBJECTIVE STATEMENT: Patient reports taping and ionto helped her pain a lot. Pain is 6/10   From Eval: Right shoulder started hurting 6 months ago for no reason  Doctor gave table ex's (flexion,  abduction), doorway and turn body wall stretch Hand dominance: Right  PERTINENT HISTORY: Menopause at age 4; non diabetic Back surgery 2017  PAIN:   Are you having pain? Yes NPRS scale: 6/10 Pain location: right shoulder, shoots to elbow Pain orientation: Right  PAIN TYPE: throbbing Pain description: constant  Aggravating factors: pain at night arm behind back, hard to comb hair and hook my bra; wiping myself after I go to the bathroom Relieving factors: heat, ice, aspirin  ; sitting   PRECAUTIONS: None    WEIGHT BEARING RESTRICTIONS: No  FALLS:  Has patient fallen in last 6 months? No   OCCUPATION: Light duty-computer work uses a mouse/keyboard; usually take boxes and trash out but someone else helps with that now  PLOF: Independent  PATIENT GOALS:be able to do my hair, wipe myself, put bra on, get dressed without pain   NEXT MD VISIT:   OBJECTIVE:  Note: Objective measures were completed at Evaluation unless otherwise noted.  DIAGNOSTIC FINDINGS:  MRI IMAGING: IMPRESSION: 1. Tendinosis of the rotator cuff without focal tear. 2. Thickening and increased signal intensity of the inferior capsule the axillary pouch with small joint effusion and proliferative tissue of the joint. This is nonspecific and may be related to previous injury of the inferior glenohumeral ligament or adhesive capsulitis. 3. Trace apparent reactive fluid of the subacromial subdeltoid bursa. 4. Degenerative labral changes with probable type I SLAP.   PATIENT SURVEYS:  Quick Dash 75% severe difficulty with  ADLs  COGNITION: Overall cognitive status: Within functional limits for tasks assessed      UPPER EXTREMITY ROM:  cervical ROM WFLs except mild limitation with extension and sidebending (not painful); painful with scapular retraction  Active ROM Right Eval (supine) 09/26/2023 Standing Left eval Right  supine Right 5/30 6/4  Shoulder flexion 95 95 158 106 supine 108 standing 103 seated  Shoulder extension        Shoulder abduction 76 65 152 Sidelying 90 degrees   99  Shoulder adduction        Shoulder internal rotation Right buttock   Radial styloid T8   Right buttock  Shoulder external rotation Ulnar styloid to ear 35  Ulnar styloid to C4;45   Ulnar styloid to ear  40 degrees  Elbow flexion WFLs  WFLs     Elbow extension 0  0     Wrist flexion        Wrist extension        Wrist ulnar deviation        Wrist radial deviation        Wrist pronation        Wrist supination        (Blank rows = not tested)  UPPER EXTREMITY MMT:  MMT Right eval Left eval Right 6/4  Shoulder flexion 3- 5   Shoulder extension 4 5   Shoulder abduction 3- 5   Shoulder adduction     Shoulder internal rotation 3 5   Shoulder external rotation 3 5   Middle trapezius 3 5   Lower trapezius 3 5   Elbow flexion 4 5   Elbow extension 4- 5   Wrist flexion     Wrist extension     Wrist ulnar deviation     Wrist radial deviation     Wrist pronation     Wrist supination     Grip strength (lbs) 35# 50# 44#  (Blank rows = not tested)  JOINT MOBILITY TESTING:  Glenohumeral and scapular  hypomobility; pain relief with glenohumeral distraction  PALPATION:  Tender points in upper traps                                                                                                                             TREATMENT DATE:  10/24/2023 Moist heat concurrent with manual therapy Manual therapy: glenohumeral joint mobs: grade 3 inferior, posterior and distractions, mobs with movement; scapular  mobilization medial, lateral, superior and inferior; scapular dissociation Pendulum 4# swings x 10 each direction bilateral  Seated shoulder flexion with dowel & rainbow motion with dowel x 10 on Rt  KT to both shoulders: 2 vertical strips anterior and posterior deltoids and 1 horizontal strip across distal deltoids instruction in 2-3 day wear time Iontophoresis dexamethasone  patch to right subacromial region 4 hour wear time    10/19/2023 Moist heat concurrent with manual therapy Manual therapy: glenohumeral joint mobs: grade 3 inferior, posterior and distractions, mobs with movement; scapular mobilization medial, lateral, superior and inferior; scapular dissociation Holding counter stepping backward for shoulder stretch 10x Pendulum 4# swings 3 minutes Discussion of exercises for home that are low irritability: standing and Prone pendulums, counter L walk aways (pick 2-3 ex's at a time) KT to both shoulders: 2 vertical strips anterior and posterior deltoids and 1 horizontal strip across distal deltoids instruction in 2-3 day wear time Iontophoresis dexamethasone  patch to right subacromial region 4 hour wear time (see pt instruction handout)  10/11/2023 Discussed possible injection along with PT Moist heat concurrent with manual therapy Manual therapy: glenohumeral joint mobs: grade 3 inferior, posterior and distractions, mobs with movement; scapular mobilization medial, lateral, superior and inferior; scapular dissociation Internal rotation with hands on counter top 10x Holding counter stepping backward for shoulder stretch 10x Pendulum 4# swings 3 minutes Standing blue ball roll on mat table 20x Prone pendulums 3 sets of 5 Prone shoulder extension to hip 2 sets of 5 Prone beach ball roll for flexion 2 sets of 5 Prone horizontal abduction small range 2 sets of 5 Wall slides up the wall 10x Hand on wall turning body for pectoral stretch 10x  10/06/2023 Moist heat concurrent with manual  therapy Manual therapy: glenohumeral joint mobs: grade 3 inferior, posterior and distractions, mobs with movement; scapular mobilization medial, lateral, superior and inferior; scapular dissociation Thoracic extension seated with towel roll 10x Pendulum 5# swings 3 minutes In the doorway holding doorframe with neck rotation left 10x Hands on high mat table while taking steps backward to stretch right shoulder 10x UE ranger on 1st, 2nd and 3rd steps 10x each Isometric hold yellow band with side stepping 3 steps: internal and external rotation 5x each Seated bear hug with yellow band 10x  Standing biceps red band 10x Standing triceps red band 10x Walk to other gym with focus on arm swing UE Ranger on wall 1/3 of the way elevation:  flexion 10x, 1/4 way scaption 10x     PATIENT EDUCATION: Education details: Educated patient  on anatomy and physiology of current symptoms, prognosis, plan of care as well as initial self care strategies to promote recovery Person educated: Patient Education method: Explanation Education comprehension: verbalized understanding  HOME EXERCISE PROGRAM: Access Code: [email or web address] URL: [email or web address] Date: 09/07/2023 Prepared by: Darien Eden  Exercises - Supine Shoulder Flexion PROM (Mirrored)  - 1 x daily - 7 x weekly - 1 sets - 3-5 reps - 30 hold - Standing Shoulder Inferior Glide with Towel Roll (Mirrored)  - 1 x daily - 7 x weekly - 1 sets - 10 reps - Circular Shoulder Pendulum with Table Support  - 1 x daily - 7 x weekly - 1 sets - 10 reps  ASSESSMENT:  CLINICAL IMPRESSION: Patient presents with 6/10 pain today. She felt the kinesiotaping  and ionto was very helpful at decreasing her pain levels . Performed techniques again today to help patient's ability to perform functional activities. Incorporated gentel active assisted ROM after manual techniques. Patient is scheduled to have her injection at the end of the month. Patient will  benefit from skilled PT to address the below impairments and improve overall function.     OBJECTIVE IMPAIRMENTS: decreased activity tolerance, decreased ROM, decreased strength, hypomobility, increased fascial restrictions, impaired perceived functional ability, impaired UE functional use, and pain.   ACTIVITY LIMITATIONS: carrying, lifting, sleeping, toileting, dressing, reach over head, and hygiene/grooming  PARTICIPATION LIMITATIONS: meal prep, cleaning, laundry, and occupation  PERSONAL FACTORS: Time since onset of injury/illness/exacerbation are also affecting patient's functional outcome.   REHAB POTENTIAL: Good  CLINICAL DECISION MAKING: Stable/uncomplicated  EVALUATION COMPLEXITY: Low   GOALS: Goals reviewed with patient? Yes  SHORT TERM GOALS: Target date: 10/05/2023   The patient will demonstrate knowledge of basic self care strategies and exercises to promote healing  Baseline: Goal status: met 5/30 2.  Improved right shoulder extension and internal rotation ROM needed for toileting/wiping Baseline:  Goal status: ongoing  3.  Improved right shoulder flexion to 108 degrees needed for combing her hair Baseline:  Goal status: met 5/30  4.  Improved grip strength to 42# needed for carrying bags or boxes Baseline:  Goal status: met 6/4    LONG TERM GOALS: Target date: 11/02/2023    The patient will be independent in a safe self progression of a home exercise program to promote further recovery of function   Baseline:  Goal status: INITIAL  2.  The patient will have improved shoulder elevation ROM to at least 120 degrees needed for grooming/dressing purposes as well as reaching high shelves  Baseline:  Goal status: INITIAL  3.  The patient will have grossly 4-/5 strength needed to lift and lower a 2# object from a high shelf  Baseline:  Goal status: INITIAL  4.  The patient will report a 60% improvement in pain levels with functional activities which  are currently difficult including sleeping, grooming and dressing tasks Baseline:  Goal status: INITIAL  5.  Quick DASH score improved to 62% indicating improved function with less pain Baseline:  Goal status: INITIAL   PLAN:  PT FREQUENCY: 1-2x/week  PT DURATION: 8 weeks  PLANNED INTERVENTIONS: 97164- PT Re-evaluation, 97110-Therapeutic exercises, 97530- Therapeutic activity, 97112- Neuromuscular re-education, 97535- Self Care, 54098- Manual therapy, [id number]- Aquatic Therapy, J1914- Electrical stimulation (unattended), [id number]- Electrical stimulation (manual), [id number]- Ionotophoresis 4mg /ml Dexamethasone , Patient/Family education, Taping, Dry Needling, Joint mobilization, Spinal mobilization, Cryotherapy, and Moist heat  PLAN FOR NEXT SESSION:   assess response to ionto and KT #2; continue ROM and strength of  right shoulder;  glenohumeral and scapular joint mobilizations;    Penelope Bowie, PT 10/24/23 1:59 PM

## 2023-10-30 ENCOUNTER — Encounter: Payer: Self-pay | Admitting: Physical Therapy

## 2023-10-30 ENCOUNTER — Ambulatory Visit: Admitting: Physical Therapy

## 2023-10-30 DIAGNOSIS — M25511 Pain in right shoulder: Secondary | ICD-10-CM | POA: Diagnosis not present

## 2023-10-30 DIAGNOSIS — M6281 Muscle weakness (generalized): Secondary | ICD-10-CM

## 2023-10-30 DIAGNOSIS — M25611 Stiffness of right shoulder, not elsewhere classified: Secondary | ICD-10-CM

## 2023-10-30 DIAGNOSIS — M25512 Pain in left shoulder: Secondary | ICD-10-CM

## 2023-10-30 DIAGNOSIS — G8929 Other chronic pain: Secondary | ICD-10-CM

## 2023-10-30 NOTE — Therapy (Signed)
 OUTPATIENT PHYSICAL THERAPY SHOULDER TREATMENT/ RE-CERTIFICATION   Patient Name: [name] MRN: [id number] DOB:[birth year], 53 y.o., female Today's Date: 10/30/2023  END OF SESSION:  PT End of Session - 10/30/23 1348     Visit Number 11    Date for PT Re-Evaluation 12/25/23    Authorization Type [employer] no auth req    PT Start Time 1230    PT Stop Time 1309    PT Time Calculation (min) 39 min    Activity Tolerance Patient tolerated treatment well    Behavior During Therapy WFL for tasks assessed/performed               Past Medical History:  Diagnosis Date   Anxiety    Complication of anesthesia    slow to wake up   Elevated LFTs    Family history of adverse reaction to anesthesia    mom slow to wake up   Headache    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   History of kidney stones    Hx of migraines    Hyperlipidemia    hx of   Menses, irregular    Pineal gland cyst    Thyroid disease    hx abnormal thyroid levels   Past Surgical History:  Procedure Laterality Date   KIDNEY STONE SURGERY  2009   LUMBAR LAMINECTOMY/DECOMPRESSION MICRODISCECTOMY Left 08/03/2016   Procedure: Microlumbar decompression L5-S1 left;  Surgeon: Debra Billing, MD;  Location: [facility];  Service: Orthopedics;  Laterality: Left;   TUBAL LIGATION     WISDOM TOOTH EXTRACTION     Patient Active Problem List   Diagnosis Date Noted   HNP (herniated nucleus pulposus), lumbar 08/03/2016   Smoker 08/14/2013   Vitamin D  deficiency 08/14/2013   Subclinical hypothyroidism 08/14/2013   Pineal gland cyst    Hyperlipidemia    Thyroid disease    Menses, irregular    History of kidney stones    Elevated LFTs    Hx of migraines     PCP: Debra Mole, PA-C  REFERRING PROVIDER: Waddell Cain PA-C  REFERRING DIAG: right adhesive capsulitis  THERAPY DIAG:  Right shoulder pain, right shoulder stiffness, weakness  Rationale for Evaluation and Treatment: Rehabilitation  ONSET DATE: 6  months  SUBJECTIVE:                                                                                                                                                                                      SUBJECTIVE STATEMENT: Patient feels her left arm is freezing up. She is having a hard time getting it behind her head. She is scheduled for an injection tomorrow. Noted improved ROM since starting  therapy. Pain 7/10   From Eval: Right shoulder started hurting 6 months ago for no reason  Doctor gave table ex's (flexion, abduction), doorway and turn body wall stretch Hand dominance: Right  PERTINENT HISTORY: Menopause at age 67; non diabetic Back surgery 2017  PAIN:   Are you having pain? Yes NPRS scale: 7/10 Pain location: right shoulder, shoots to elbow Pain orientation: Right  PAIN TYPE: throbbing Pain description: constant  Aggravating factors: pain at night arm behind back, hard to comb hair and hook my bra; wiping myself after I go to the bathroom Relieving factors: heat, ice, aspirin  ; sitting   PRECAUTIONS: None    WEIGHT BEARING RESTRICTIONS: No  FALLS:  Has patient fallen in last 6 months? No   OCCUPATION: Light duty-computer work uses a mouse/keyboard; usually take boxes and trash out but someone else helps with that now  PLOF: Independent  PATIENT GOALS:be able to do my hair, wipe myself, put bra on, get dressed without pain   NEXT MD VISIT:   OBJECTIVE:  Note: Objective measures were completed at Evaluation unless otherwise noted.  DIAGNOSTIC FINDINGS:  MRI IMAGING: IMPRESSION: 1. Tendinosis of the rotator cuff without focal tear. 2. Thickening and increased signal intensity of the inferior capsule the axillary pouch with small joint effusion and proliferative tissue of the joint. This is nonspecific and may be related to previous injury of the inferior glenohumeral ligament or adhesive capsulitis. 3. Trace apparent reactive fluid of the subacromial  subdeltoid bursa. 4. Degenerative labral changes with probable type I SLAP.   PATIENT SURVEYS:  Quick Dash 75% severe difficulty with ADLs 10/30/2023 Quick: 61.4/100 61.4%  COGNITION: Overall cognitive status: Within functional limits for tasks assessed      UPPER EXTREMITY ROM:  cervical ROM WFLs except mild limitation with extension and sidebending (not painful); painful with scapular retraction  Active ROM Right Eval (supine) 09/26/2023 Standing Left eval Right  supine Right 5/30 6/4  Shoulder flexion 95 95 158 106 supine 108 standing 103 seated  Shoulder extension        Shoulder abduction 76 65 152 Sidelying 90 degrees   99  Shoulder adduction        Shoulder internal rotation Right buttock   Radial styloid T8   Right buttock  Shoulder external rotation Ulnar styloid to ear 35  Ulnar styloid to C4;45   Ulnar styloid to ear  40 degrees  Elbow flexion WFLs  WFLs     Elbow extension 0  0     Wrist flexion        Wrist extension        Wrist ulnar deviation        Wrist radial deviation        Wrist pronation        Wrist supination        (Blank rows = not tested)  UPPER EXTREMITY MMT:  MMT Right eval Left eval Right 6/4  Shoulder flexion 3- 5   Shoulder extension 4 5   Shoulder abduction 3- 5   Shoulder adduction     Shoulder internal rotation 3 5   Shoulder external rotation 3 5   Middle trapezius 3 5   Lower trapezius 3 5   Elbow flexion 4 5   Elbow extension 4- 5   Wrist flexion     Wrist extension     Wrist ulnar deviation     Wrist radial deviation     Wrist pronation  Wrist supination     Grip strength (lbs) 35# 50# 44#  (Blank rows = not tested)  JOINT MOBILITY TESTING:  Glenohumeral and scapular hypomobility; pain relief with glenohumeral distraction  PALPATION:  Tender points in upper traps                                                                                                                             TREATMENT DATE:   10/30/2023 [name]: 61.4/100 61.4% Thoracic extension seated with towel roll 10x Table supported shoulder flexion & scaption towel slides x 10 bilateral  Pendulum 4# swings x 10 each direction bilateral  Holding counter stepping backward for shoulder stretch 10x 5 sec hold Hand on wall turning body for pectoral stretch 5x 10sec hold bilateral  Shoulder ER/IR walkouts with yellow TB x 5 reps (3 steps) bilateral  KT to both shoulders: 2 vertical strips anterior and posterior deltoids and 1 horizontal strip across distal deltoids instruction in 2-3 day wear time     10/24/2023 Moist heat concurrent with manual therapy Manual therapy: glenohumeral joint mobs: grade 3 inferior, posterior and distractions, mobs with movement; scapular mobilization medial, lateral, superior and inferior; scapular dissociation Pendulum 4# swings x 10 each direction bilateral  Seated shoulder flexion with dowel & rainbow motion with dowel x 10 on Rt  KT to both shoulders: 2 vertical strips anterior and posterior deltoids and 1 horizontal strip across distal deltoids instruction in 2-3 day wear time Iontophoresis dexamethasone  patch to right subacromial region 4 hour wear time    10/19/2023 Moist heat concurrent with manual therapy Manual therapy: glenohumeral joint mobs: grade 3 inferior, posterior and distractions, mobs with movement; scapular mobilization medial, lateral, superior and inferior; scapular dissociation Holding counter stepping backward for shoulder stretch 10x Pendulum 4# swings 3 minutes Discussion of exercises for home that are low irritability: standing and Prone pendulums, counter L walk aways (pick 2-3 ex's at a time) KT to both shoulders: 2 vertical strips anterior and posterior deltoids and 1 horizontal strip across distal deltoids instruction in 2-3 day wear time Iontophoresis dexamethasone  patch to right subacromial region 4 hour wear time (see pt instruction handout)     PATIENT  EDUCATION: Education details: Educated patient on anatomy and physiology of current symptoms, prognosis, plan of care as well as initial self care strategies to promote recovery Person educated: Patient Education method: Explanation Education comprehension: verbalized understanding  HOME EXERCISE PROGRAM: Access Code: [email or web address] URL: [email or web address] Date: 09/07/2023 Prepared by: Glade Pesa  Exercises - Supine Shoulder Flexion PROM (Mirrored)  - 1 x daily - 7 x weekly - 1 sets - 3-5 reps - 30 hold - Standing Shoulder Inferior Glide with Towel Roll (Mirrored)  - 1 x daily - 7 x weekly - 1 sets - 10 reps - Circular Shoulder Pendulum with Table Support  - 1 x daily - 7 x weekly - 1 sets - 10 reps  ASSESSMENT:  CLINICAL IMPRESSION: Re-certification completed today.  Since starting therapy Michelena reports she has more right shoulder ROM and she feels the taping is very helpful. Now, she feels her left shoulder freezing up and she has a challenge reaching her left arm behind her head. She is scheduled for a right shoulder injection tomorrow. She is progressing slowly towards goals, but she should meet them at end of POC. Patient will benefit from skilled PT to address the below impairments and improve overall function.      OBJECTIVE IMPAIRMENTS: decreased activity tolerance, decreased ROM, decreased strength, hypomobility, increased fascial restrictions, impaired perceived functional ability, impaired UE functional use, and pain.   ACTIVITY LIMITATIONS: carrying, lifting, sleeping, toileting, dressing, reach over head, and hygiene/grooming  PARTICIPATION LIMITATIONS: meal prep, cleaning, laundry, and occupation  PERSONAL FACTORS: Time since onset of injury/illness/exacerbation are also affecting patient's functional outcome.   REHAB POTENTIAL: Good  CLINICAL DECISION MAKING: Stable/uncomplicated  EVALUATION COMPLEXITY: Low   GOALS: Goals reviewed with patient?  Yes  SHORT TERM GOALS: Target date: 10/05/2023   The patient will demonstrate knowledge of basic self care strategies and exercises to promote healing  Baseline: Goal status: met 5/30 2.  Improved right shoulder extension and internal rotation ROM needed for toileting/wiping Baseline:  Goal status: ongoing  3.  Improved right shoulder flexion to 108 degrees needed for combing her hair Baseline:  Goal status: met 5/30  4.  Improved grip strength to 42# needed for carrying bags or boxes Baseline:  Goal status: met 6/4    LONG TERM GOALS: Target date:12/25/2023  The patient will be independent in a safe self progression of a home exercise program to promote further recovery of function   Baseline:  Goal status: In progress 10/30/2023  2.  The patient will have improved shoulder elevation ROM to at least 120 degrees needed for grooming/dressing purposes as well as reaching high shelves  Baseline:  Goal status: INITIAL  3.  The patient will have grossly 4-/5 strength needed to lift and lower a 2# object from a high shelf  Baseline:  Goal status: In progress 10/30/2023  4.  The patient will report a 60% improvement in pain levels with functional activities which are currently difficult including sleeping, grooming and dressing tasks Baseline:  Goal status: In progress 10/30/2023  5.  Quick DASH score improved to 62% indicating improved function with less pain Baseline: 61.4% Goal status: In progress 10/30/2023   PLAN:  PT FREQUENCY: 1-2x/week  PT DURATION: 8 weeks  PLANNED INTERVENTIONS: 97164- PT Re-evaluation, 97110-Therapeutic exercises, 97530- Therapeutic activity, 97112- Neuromuscular re-education, 97535- Self Care, [id number]- Manual therapy, [phone]- Aquatic Therapy, [phone]- Electrical stimulation (unattended), [phone]- Electrical stimulation (manual), [phone]- Ionotophoresis 4mg /ml Dexamethasone , Patient/Family education, Taping, Dry Needling, Joint mobilization, Spinal  mobilization, Cryotherapy, and Moist heat  PLAN FOR NEXT SESSION:   assess shoulder after injection; continue ROM and strength of right shoulder;  glenohumeral and scapular joint mobilizations;    Kristeen Sar, PT 10/30/23 1:50 PM

## 2023-11-07 ENCOUNTER — Ambulatory Visit: Payer: Self-pay | Attending: Physician Assistant | Admitting: Physical Therapy

## 2023-11-07 DIAGNOSIS — G8929 Other chronic pain: Secondary | ICD-10-CM | POA: Diagnosis present

## 2023-11-07 DIAGNOSIS — M25511 Pain in right shoulder: Secondary | ICD-10-CM | POA: Insufficient documentation

## 2023-11-07 DIAGNOSIS — M25611 Stiffness of right shoulder, not elsewhere classified: Secondary | ICD-10-CM | POA: Diagnosis present

## 2023-11-07 DIAGNOSIS — M6281 Muscle weakness (generalized): Secondary | ICD-10-CM | POA: Insufficient documentation

## 2023-11-07 DIAGNOSIS — M25512 Pain in left shoulder: Secondary | ICD-10-CM | POA: Insufficient documentation

## 2023-11-07 NOTE — Therapy (Signed)
 OUTPATIENT PHYSICAL THERAPY SHOULDER TREATMENT   Patient Name: [name] MRN: [id number] DOB:[birth year], 53 y.o., female Today's Date: 11/07/2023  END OF SESSION:  PT End of Session - 11/07/23 1105     Visit Number 12    Date for PT Re-Evaluation 12/25/23    Authorization Type [employer] no auth req    PT Start Time 1105    PT Stop Time 1145    PT Time Calculation (min) 40 min    Activity Tolerance Patient tolerated treatment well               Past Medical History:  Diagnosis Date   Anxiety    Complication of anesthesia    slow to wake up   Elevated LFTs    Family history of adverse reaction to anesthesia    mom slow to wake up   Headache    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   History of kidney stones    Hx of migraines    Hyperlipidemia    hx of   Menses, irregular    Pineal gland cyst    Thyroid disease    hx abnormal thyroid levels   Past Surgical History:  Procedure Laterality Date   KIDNEY STONE SURGERY  2009   LUMBAR LAMINECTOMY/DECOMPRESSION MICRODISCECTOMY Left 08/03/2016   Procedure: Microlumbar decompression L5-S1 left;  Surgeon: Reyes Billing, MD;  Location: [facility];  Service: Orthopedics;  Laterality: Left;   TUBAL LIGATION     WISDOM TOOTH EXTRACTION     Patient Active Problem List   Diagnosis Date Noted   HNP (herniated nucleus pulposus), lumbar 08/03/2016   Smoker 08/14/2013   Vitamin D  deficiency 08/14/2013   Subclinical hypothyroidism 08/14/2013   Pineal gland cyst    Hyperlipidemia    Thyroid disease    Menses, irregular    History of kidney stones    Elevated LFTs    Hx of migraines     PCP: Leavy Mole, PA-C  REFERRING PROVIDER: Waddell Cain PA-C  REFERRING DIAG: right adhesive capsulitis  THERAPY DIAG:  Right shoulder pain, right shoulder stiffness, weakness  Rationale for Evaluation and Treatment: Rehabilitation  ONSET DATE: 6 months  SUBJECTIVE:                                                                                                                                                                                       SUBJECTIVE STATEMENT: Injection helped a lot right shoulder no pain just stiff, left arm is freezing up getting injection in that one 7/11     Patient feels her left arm is freezing up. She is having a hard time getting it behind  her head. She is scheduled for an injection tomorrow. Noted improved ROM since starting therapy. Pain 7/10   From Eval: Right shoulder started hurting 6 months ago for no reason  Doctor gave table ex's (flexion, abduction), doorway and turn body wall stretch Hand dominance: Right  PERTINENT HISTORY: Menopause at age 88; non diabetic Back surgery 2017  PAIN:   Are you having pain? Yes NPRS scale: 7-8/10 Pain location: left shoulder Pain orientation: Right  PAIN TYPE: throbbing Pain description: constant  Aggravating factors: pain at night arm behind back, hard to comb hair and hook my bra; wiping myself after I go to the bathroom Relieving factors: heat, ice, aspirin  ; sitting   PRECAUTIONS: None    WEIGHT BEARING RESTRICTIONS: No  FALLS:  Has patient fallen in last 6 months? No   OCCUPATION: Light duty-computer work uses a mouse/keyboard; usually take boxes and trash out but someone else helps with that now  PLOF: Independent  PATIENT GOALS:be able to do my hair, wipe myself, put bra on, get dressed without pain   NEXT MD VISIT:   OBJECTIVE:  Note: Objective measures were completed at Evaluation unless otherwise noted.  DIAGNOSTIC FINDINGS:  MRI IMAGING: IMPRESSION: 1. Tendinosis of the rotator cuff without focal tear. 2. Thickening and increased signal intensity of the inferior capsule the axillary pouch with small joint effusion and proliferative tissue of the joint. This is nonspecific and may be related to previous injury of the inferior glenohumeral ligament or adhesive capsulitis. 3. Trace apparent reactive fluid of the  subacromial subdeltoid bursa. 4. Degenerative labral changes with probable type I SLAP.   PATIENT SURVEYS:  Quick Dash 75% severe difficulty with ADLs 10/30/2023 Quick: 61.4/100 61.4%  COGNITION: Overall cognitive status: Within functional limits for tasks assessed      UPPER EXTREMITY ROM:  cervical ROM WFLs except mild limitation with extension and sidebending (not painful); painful with scapular retraction  Active ROM Right Eval (supine) 09/26/2023 Standing Left eval Right  supine Right 5/30 6/4 7/1  Shoulder flexion 95 95 158 106 supine 108 standing 103 seated R   106 seated; 111 supine R;  L 126 supine, 120 seated  Shoulder extension         Shoulder abduction 76 65 152 Sidelying 90 degrees   99 R 97   Shoulder adduction         Shoulder internal rotation Right buttock   Radial styloid T8   Right buttock R buttock  Shoulder external rotation Ulnar styloid to ear 35  Ulnar styloid to C4;45   Ulnar styloid to ear  40 degrees R middle finger to C7  Elbow flexion WFLs  WFLs      Elbow extension 0  0      Wrist flexion         Wrist extension         Wrist ulnar deviation         Wrist radial deviation         Wrist pronation         Wrist supination         (Blank rows = not tested)  UPPER EXTREMITY MMT:  MMT Right eval Left eval Right 6/4  Shoulder flexion 3- 5   Shoulder extension 4 5   Shoulder abduction 3- 5   Shoulder adduction     Shoulder internal rotation 3 5   Shoulder external rotation 3 5   Middle trapezius 3 5   Lower trapezius 3  5   Elbow flexion 4 5   Elbow extension 4- 5   Wrist flexion     Wrist extension     Wrist ulnar deviation     Wrist radial deviation     Wrist pronation     Wrist supination     Grip strength (lbs) 35# 50# 44#  (Blank rows = not tested)  JOINT MOBILITY TESTING:  Glenohumeral and scapular hypomobility; pain relief with glenohumeral distraction  PALPATION:  Tender points in upper traps                                                                                                                              TREATMENT DATE:  11/07/2023 Moist heat concurrent with manual therapy Manual therapy: glenohumeral joint mobs: grade 3 inferior, posterior and distractions, mobs with movement Holding counter stepping backward for shoulder stretch 10x 5 sec hold Hand on doorway turning head for pectoral stretch 5x 10sec hold bilateral  UE Ranger on mat table forward flexion 10x right/left  Pendulums 4# 15x right/left Encouraged frequent ROM (has pulleys at home) especially in supine every 2 hours KT left shoulder: (right shoulder skin irritation so no tape today) 2 vertical strips anterior and posterior deltoids and 1 horizontal strip across distal deltoids instruction in 2-3 day wear time   10/30/2023 Quick Dash: 61.4/100 61.4% Thoracic extension seated with towel roll 10x Table supported shoulder flexion & scaption towel slides x 10 bilateral  Pendulum 4# swings x 10 each direction bilateral  Holding counter stepping backward for shoulder stretch 10x 5 sec hold Hand on wall turning body for pectoral stretch 5x 10sec hold bilateral  Shoulder ER/IR walkouts with yellow TB x 5 reps (3 steps) bilateral  KT to both shoulders: 2 vertical strips anterior and posterior deltoids and 1 horizontal strip across distal deltoids instruction in 2-3 day wear time     10/24/2023 Moist heat concurrent with manual therapy Manual therapy: glenohumeral joint mobs: grade 3 inferior, posterior and distractions, mobs with movement; scapular mobilization medial, lateral, superior and inferior; scapular dissociation Pendulum 4# swings x 10 each direction bilateral  Seated shoulder flexion with dowel & rainbow motion with dowel x 10 on Rt  KT to both shoulders: 2 vertical strips anterior and posterior deltoids and 1 horizontal strip across distal deltoids instruction in 2-3 day wear time Iontophoresis dexamethasone  patch to right  subacromial region 4 hour wear time    10/19/2023 Moist heat concurrent with manual therapy Manual therapy: glenohumeral joint mobs: grade 3 inferior, posterior and distractions, mobs with movement; scapular mobilization medial, lateral, superior and inferior; scapular dissociation Holding counter stepping backward for shoulder stretch 10x Pendulum 4# swings 3 minutes Discussion of exercises for home that are low irritability: standing and Prone pendulums, counter L walk aways (pick 2-3 ex's at a time) KT to both shoulders: 2 vertical strips anterior and posterior deltoids and 1 horizontal strip across distal deltoids instruction in 2-3 day wear  time Iontophoresis dexamethasone  patch to right subacromial region 4 hour wear time (see pt instruction handout)     PATIENT EDUCATION: Education details: Educated patient on anatomy and physiology of current symptoms, prognosis, plan of care as well as initial self care strategies to promote recovery Person educated: Patient Education method: Explanation Education comprehension: verbalized understanding  HOME EXERCISE PROGRAM: Access Code: [email or web address] URL: [email or web address] Date: 09/07/2023 Prepared by: Glade Pesa  Exercises - Supine Shoulder Flexion PROM (Mirrored)  - 1 x daily - 7 x weekly - 1 sets - 3-5 reps - 30 hold - Standing Shoulder Inferior Glide with Towel Roll (Mirrored)  - 1 x daily - 7 x weekly - 1 sets - 10 reps - Circular Shoulder Pendulum with Table Support  - 1 x daily - 7 x weekly - 1 sets - 10 reps  ASSESSMENT:  CLINICAL IMPRESSION: [name] reports decreased right shoulder pain since she received an injection but she reports continued lack of shoulder mobility.  Modest improvements in elevation ROM noted.  Pain in right shoulder today is mainly with endrange overpressure.  Unfortunately she is also developing a similar pattern in her left shoulder with increased pain and decreasing ROM since initial  evaluation.  Therapist monitoring response to all interventions and providing cues to decrease compensatory shoulder shrug.       OBJECTIVE IMPAIRMENTS: decreased activity tolerance, decreased ROM, decreased strength, hypomobility, increased fascial restrictions, impaired perceived functional ability, impaired UE functional use, and pain.   ACTIVITY LIMITATIONS: carrying, lifting, sleeping, toileting, dressing, reach over head, and hygiene/grooming  PARTICIPATION LIMITATIONS: meal prep, cleaning, laundry, and occupation  PERSONAL FACTORS: Time since onset of injury/illness/exacerbation are also affecting patient's functional outcome.   REHAB POTENTIAL: Good  CLINICAL DECISION MAKING: Stable/uncomplicated  EVALUATION COMPLEXITY: Low   GOALS: Goals reviewed with patient? Yes  SHORT TERM GOALS: Target date: 10/05/2023   The patient will demonstrate knowledge of basic self care strategies and exercises to promote healing  Baseline: Goal status: met 5/30 2.  Improved right shoulder extension and internal rotation ROM needed for toileting/wiping Baseline:  Goal status: ongoing  3.  Improved right shoulder flexion to 108 degrees needed for combing her hair Baseline:  Goal status: met 5/30  4.  Improved grip strength to 42# needed for carrying bags or boxes Baseline:  Goal status: met 6/4    LONG TERM GOALS: Target date:12/25/2023  The patient will be independent in a safe self progression of a home exercise program to promote further recovery of function   Baseline:  Goal status: In progress 10/30/2023  2.  The patient will have improved shoulder elevation ROM to at least 120 degrees needed for grooming/dressing purposes as well as reaching high shelves  Baseline:  Goal status: INITIAL  3.  The patient will have grossly 4-/5 strength needed to lift and lower a 2# object from a high shelf  Baseline:  Goal status: In progress 10/30/2023  4.  The patient will report a 60%  improvement in pain levels with functional activities which are currently difficult including sleeping, grooming and dressing tasks Baseline:  Goal status: In progress 10/30/2023  5.  Quick DASH score improved to 62% indicating improved function with less pain Baseline: 61.4% Goal status: In progress 10/30/2023   PLAN:  PT FREQUENCY: 1-2x/week  PT DURATION: 8 weeks  PLANNED INTERVENTIONS: 97164- PT Re-evaluation, 97110-Therapeutic exercises, 97530- Therapeutic activity, 97112- Neuromuscular re-education, 97535- Self Care, 02859- Manual therapy, [id number]- Aquatic Therapy, H9716- Electrical stimulation (unattended), [phone]- Electrical stimulation (  manual), 02966- Ionotophoresis 4mg /ml Dexamethasone , Patient/Family education, Taping, Dry Needling, Joint mobilization, Spinal mobilization, Cryotherapy, and Moist heat  PLAN FOR NEXT SESSION: see how MD appt goes tomorrow regarding hormone replacement therapy; no tape next time on left since injection the next day 7/11; continue ROM and strength of right shoulder;  glenohumeral and scapular joint mobilizations  Glade Pesa, PT 11/07/23 12:44 PM Phone: [phone] Fax: [phone]

## 2023-11-16 ENCOUNTER — Encounter: Payer: Self-pay | Admitting: Physical Therapy

## 2023-11-16 ENCOUNTER — Ambulatory Visit: Payer: Self-pay | Admitting: Physical Therapy

## 2023-11-16 DIAGNOSIS — M25512 Pain in left shoulder: Secondary | ICD-10-CM

## 2023-11-16 DIAGNOSIS — M6281 Muscle weakness (generalized): Secondary | ICD-10-CM

## 2023-11-16 DIAGNOSIS — G8929 Other chronic pain: Secondary | ICD-10-CM

## 2023-11-16 DIAGNOSIS — M25611 Stiffness of right shoulder, not elsewhere classified: Secondary | ICD-10-CM

## 2023-11-16 DIAGNOSIS — M25511 Pain in right shoulder: Secondary | ICD-10-CM | POA: Diagnosis not present

## 2023-11-16 NOTE — Therapy (Signed)
 OUTPATIENT PHYSICAL THERAPY SHOULDER TREATMENT   Patient Name: [name] MRN: [id number] DOB:12-22-1970, 53 y.o., female Today's Date: 11/16/2023  END OF SESSION:  PT End of Session - 11/16/23 1455     Visit Number 13    Date for PT Re-Evaluation 12/25/23    Authorization Type [employer] no auth req    PT Start Time 1404    PT Stop Time 1444    PT Time Calculation (min) 40 min    Activity Tolerance Patient tolerated treatment well    Behavior During Therapy WFL for tasks assessed/performed                Past Medical History:  Diagnosis Date   Anxiety    Complication of anesthesia    slow to wake up   Elevated LFTs    Family history of adverse reaction to anesthesia    mom slow to wake up   Headache    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   History of kidney stones    Hx of migraines    Hyperlipidemia    hx of   Menses, irregular    Pineal gland cyst    Thyroid disease    hx abnormal thyroid levels   Past Surgical History:  Procedure Laterality Date   KIDNEY STONE SURGERY  2009   LUMBAR LAMINECTOMY/DECOMPRESSION MICRODISCECTOMY Left 08/03/2016   Procedure: Microlumbar decompression L5-S1 left;  Surgeon: Reyes Billing, MD;  Location: [facility];  Service: Orthopedics;  Laterality: Left;   TUBAL LIGATION     WISDOM TOOTH EXTRACTION     Patient Active Problem List   Diagnosis Date Noted   HNP (herniated nucleus pulposus), lumbar 08/03/2016   Smoker 08/14/2013   Vitamin D  deficiency 08/14/2013   Subclinical hypothyroidism 08/14/2013   Pineal gland cyst    Hyperlipidemia    Thyroid disease    Menses, irregular    History of kidney stones    Elevated LFTs    Hx of migraines     PCP: Leavy Mole, PA-C  REFERRING PROVIDER: Waddell Cain PA-C  REFERRING DIAG: right adhesive capsulitis  THERAPY DIAG:  Right shoulder pain, right shoulder stiffness, weakness  Rationale for Evaluation and Treatment: Rehabilitation  ONSET DATE: 6 months  SUBJECTIVE:                                                                                                                                                                                       SUBJECTIVE STATEMENT: Injection helped a lot right shoulder, pt reports 1-2/10 pain today. However, left arm is freezing up, pt reports is 6/10. Getting injection in that one (tomorrow)  7/11  Patient feels her left arm is freezing up. She is having a hard time getting it behind her head. She is scheduled for an injection tomorrow. Noted improved ROM since starting therapy. Pain 7/10   From Eval: Right shoulder started hurting 6 months ago for no reason  Doctor gave table ex's (flexion, abduction), doorway and turn body wall stretch Hand dominance: Right  PERTINENT HISTORY: Menopause at age 27; non diabetic Back surgery 2017  PAIN:   Are you having pain? Yes NPRS scale: 7-8/10 Pain location: left shoulder Pain orientation: Right  PAIN TYPE: throbbing Pain description: constant  Aggravating factors: pain at night arm behind back, hard to comb hair and hook my bra; wiping myself after I go to the bathroom Relieving factors: heat, ice, aspirin  ; sitting   PRECAUTIONS: None    WEIGHT BEARING RESTRICTIONS: No  FALLS:  Has patient fallen in last 6 months? No   OCCUPATION: Light duty-computer work uses a mouse/keyboard; usually take boxes and trash out but someone else helps with that now  PLOF: Independent  PATIENT GOALS:be able to do my hair, wipe myself, put bra on, get dressed without pain   NEXT MD VISIT:   OBJECTIVE:  Note: Objective measures were completed at Evaluation unless otherwise noted.  DIAGNOSTIC FINDINGS:  MRI IMAGING: IMPRESSION: 1. Tendinosis of the rotator cuff without focal tear. 2. Thickening and increased signal intensity of the inferior capsule the axillary pouch with small joint effusion and proliferative tissue of the joint. This is nonspecific and may be related to  previous injury of the inferior glenohumeral ligament or adhesive capsulitis. 3. Trace apparent reactive fluid of the subacromial subdeltoid bursa. 4. Degenerative labral changes with probable type I SLAP.   PATIENT SURVEYS:  Quick Dash 75% severe difficulty with ADLs 10/30/2023 Quick: 61.4/100 61.4%  COGNITION: Overall cognitive status: Within functional limits for tasks assessed      UPPER EXTREMITY ROM:  cervical ROM WFLs except mild limitation with extension and sidebending (not painful); painful with scapular retraction  Active ROM Right Eval (supine) 09/26/2023 Standing Left eval Right  supine Right 5/30 6/4 7/1  Shoulder flexion 95 95 158 106 supine 108 standing 103 seated R   106 seated; 111 supine R;  L 126 supine, 120 seated  Shoulder extension         Shoulder abduction 76 65 152 Sidelying 90 degrees   99 R 97   Shoulder adduction         Shoulder internal rotation Right buttock   Radial styloid T8   Right buttock R buttock  Shoulder external rotation Ulnar styloid to ear 35  Ulnar styloid to C4;45   Ulnar styloid to ear  40 degrees R middle finger to C7  Elbow flexion WFLs  WFLs      Elbow extension 0  0      Wrist flexion         Wrist extension         Wrist ulnar deviation         Wrist radial deviation         Wrist pronation         Wrist supination         (Blank rows = not tested)  UPPER EXTREMITY MMT:  MMT Right eval Left eval Right 6/4  Shoulder flexion 3- 5   Shoulder extension 4 5   Shoulder abduction 3- 5   Shoulder adduction     Shoulder internal rotation 3 5  Shoulder external rotation 3 5   Middle trapezius 3 5   Lower trapezius 3 5   Elbow flexion 4 5   Elbow extension 4- 5   Wrist flexion     Wrist extension     Wrist ulnar deviation     Wrist radial deviation     Wrist pronation     Wrist supination     Grip strength (lbs) 35# 50# 44#  (Blank rows = not tested)  JOINT MOBILITY TESTING:  Glenohumeral and scapular hypomobility;  pain relief with glenohumeral distraction  PALPATION:  Tender points in upper traps                                                                                                                             TREATMENT DATE:  11/16/2023 Single arm ball roll ups with red stability ball 10x bilaterally Finger ladder 4x bilaterally  Holding counter stepping backward for shoulder stretch 10x 5 sec hold UE Ranger on wall forward flexion and diagonal 8x right/left  Standing shoulder row and extension with red TB 2x10 ISO walkouts using red TB for shoulder external rotation and internal rotation 3 steps x6 bilateral  Supine shoulder Alphabet with 1# using bolster under knees Supine shoulder flexion with dowel + 2.5# AW x 10    11/07/2023 Moist heat concurrent with manual therapy Manual therapy: glenohumeral joint mobs: grade 3 inferior, posterior and distractions, mobs with movement Holding counter stepping backward for shoulder stretch 10x 5 sec hold Hand on doorway turning head for pectoral stretch 5x 10sec hold bilateral  UE Ranger on mat table forward flexion 10x right/left  Pendulums 4# 15x right/left Encouraged frequent ROM (has pulleys at home) especially in supine every 2 hours KT left shoulder: (right shoulder skin irritation so no tape today) 2 vertical strips anterior and posterior deltoids and 1 horizontal strip across distal deltoids instruction in 2-3 day wear time   10/30/2023 Quick Dash: 61.4/100 61.4% Thoracic extension seated with towel roll 10x Table supported shoulder flexion & scaption towel slides x 10 bilateral  Pendulum 4# swings x 10 each direction bilateral  Holding counter stepping backward for shoulder stretch 10x 5 sec hold Hand on wall turning body for pectoral stretch 5x 10sec hold bilateral  Shoulder ER/IR walkouts with yellow TB x 5 reps (3 steps) bilateral  KT to both shoulders: 2 vertical strips anterior and posterior deltoids and 1 horizontal strip across  distal deltoids instruction in 2-3 day wear time     10/24/2023 Moist heat concurrent with manual therapy Manual therapy: glenohumeral joint mobs: grade 3 inferior, posterior and distractions, mobs with movement; scapular mobilization medial, lateral, superior and inferior; scapular dissociation Pendulum 4# swings x 10 each direction bilateral  Seated shoulder flexion with dowel & rainbow motion with dowel x 10 on Rt  KT to both shoulders: 2 vertical strips anterior and posterior deltoids and 1 horizontal strip across distal deltoids instruction in 2-3 day wear time  Iontophoresis dexamethasone  patch to right subacromial region 4 hour wear time      PATIENT EDUCATION: Education details: Educated patient on anatomy and physiology of current symptoms, prognosis, plan of care as well as initial self care strategies to promote recovery Person educated: Patient Education method: Explanation Education comprehension: verbalized understanding  HOME EXERCISE PROGRAM: Access Code: [email or web address] URL: [email or web address] Date: 09/07/2023 Prepared by: Glade Pesa  Exercises - Supine Shoulder Flexion PROM (Mirrored)  - 1 x daily - 7 x weekly - 1 sets - 3-5 reps - 30 hold - Standing Shoulder Inferior Glide with Towel Roll (Mirrored)  - 1 x daily - 7 x weekly - 1 sets - 10 reps - Circular Shoulder Pendulum with Table Support  - 1 x daily - 7 x weekly - 1 sets - 10 reps  ASSESSMENT:  CLINICAL IMPRESSION: Patient presents with minimal right shoulder pain and increased left shoulder pain. She has responded well to steroid injection in right shoulder and she is scheduled to have one in her left shoulder tomorrow. Today's treatment focused on shoulder ROM and shoulder stability. Patient tolerated treatment session well and did not verbalize any pain. Patient still heavily compensations with shoulder hiking with elevating exercises. Patient will benefit from skilled PT to address the below  impairments and improve overall function.     OBJECTIVE IMPAIRMENTS: decreased activity tolerance, decreased ROM, decreased strength, hypomobility, increased fascial restrictions, impaired perceived functional ability, impaired UE functional use, and pain.   ACTIVITY LIMITATIONS: carrying, lifting, sleeping, toileting, dressing, reach over head, and hygiene/grooming  PARTICIPATION LIMITATIONS: meal prep, cleaning, laundry, and occupation  PERSONAL FACTORS: Time since onset of injury/illness/exacerbation are also affecting patient's functional outcome.   REHAB POTENTIAL: Good  CLINICAL DECISION MAKING: Stable/uncomplicated  EVALUATION COMPLEXITY: Low   GOALS: Goals reviewed with patient? Yes  SHORT TERM GOALS: Target date: 10/05/2023   The patient will demonstrate knowledge of basic self care strategies and exercises to promote healing  Baseline: Goal status: met 5/30 2.  Improved right shoulder extension and internal rotation ROM needed for toileting/wiping Baseline:  Goal status: ongoing  3.  Improved right shoulder flexion to 108 degrees needed for combing her hair Baseline:  Goal status: met 5/30  4.  Improved grip strength to 42# needed for carrying bags or boxes Baseline:  Goal status: met 6/4    LONG TERM GOALS: Target date:12/25/2023  The patient will be independent in a safe self progression of a home exercise program to promote further recovery of function   Baseline:  Goal status: In progress 10/30/2023  2.  The patient will have improved shoulder elevation ROM to at least 120 degrees needed for grooming/dressing purposes as well as reaching high shelves  Baseline:  Goal status: INITIAL  3.  The patient will have grossly 4-/5 strength needed to lift and lower a 2# object from a high shelf  Baseline:  Goal status: In progress 10/30/2023  4.  The patient will report a 60% improvement in pain levels with functional activities which are currently difficult  including sleeping, grooming and dressing tasks Baseline:  Goal status: In progress 10/30/2023  5.  Quick DASH score improved to 62% indicating improved function with less pain Baseline: 61.4% Goal status: In progress 10/30/2023   PLAN:  PT FREQUENCY: 1-2x/week  PT DURATION: 8 weeks  PLANNED INTERVENTIONS: 97164- PT Re-evaluation, 97110-Therapeutic exercises, 97530- Therapeutic activity, 97112- Neuromuscular re-education, 97535- Self Care, [id number]- Manual therapy, [id number]- Aquatic Therapy, [id number]- Electrical stimulation (unattended), [id number]- Electrical stimulation (manual),  02966- Ionotophoresis 4mg /ml Dexamethasone , Patient/Family education, Taping, Dry Needling, Joint mobilization, Spinal mobilization, Cryotherapy, and Moist heat  PLAN FOR NEXT SESSION: assess response to injection on Lt  continue ROM and strength of right shoulder;  glenohumeral and scapular joint mobilizations  Kristeen Sar, PT 11/16/23 2:57 PM

## 2023-11-22 ENCOUNTER — Ambulatory Visit: Admitting: Physical Therapy

## 2023-11-27 ENCOUNTER — Encounter: Payer: Self-pay | Admitting: Physical Therapy

## 2023-11-27 ENCOUNTER — Ambulatory Visit: Admitting: Physical Therapy

## 2023-11-27 DIAGNOSIS — M25511 Pain in right shoulder: Secondary | ICD-10-CM | POA: Diagnosis not present

## 2023-11-27 DIAGNOSIS — M25611 Stiffness of right shoulder, not elsewhere classified: Secondary | ICD-10-CM

## 2023-11-27 DIAGNOSIS — G8929 Other chronic pain: Secondary | ICD-10-CM

## 2023-11-27 DIAGNOSIS — M25512 Pain in left shoulder: Secondary | ICD-10-CM

## 2023-11-27 DIAGNOSIS — M6281 Muscle weakness (generalized): Secondary | ICD-10-CM

## 2023-11-27 NOTE — Therapy (Signed)
 OUTPATIENT PHYSICAL THERAPY SHOULDER TREATMENT   Patient Name: [name] MRN: [id number] DOB:1971/04/22, 53 y.o., female Today's Date: 11/27/2023  END OF SESSION:  PT End of Session - 11/27/23 1015     Visit Number 14    Date for PT Re-Evaluation 12/25/23    Authorization Type [employer] no auth req    PT Start Time [phone]    PT Stop Time 1011    PT Time Calculation (min) 38 min    Activity Tolerance Patient tolerated treatment well    Behavior During Therapy WFL for tasks assessed/performed                 Past Medical History:  Diagnosis Date   Anxiety    Complication of anesthesia    slow to wake up   Elevated LFTs    Family history of adverse reaction to anesthesia    mom slow to wake up   Headache    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   History of kidney stones    Hx of migraines    Hyperlipidemia    hx of   Menses, irregular    Pineal gland cyst    Thyroid disease    hx abnormal thyroid levels   Past Surgical History:  Procedure Laterality Date   KIDNEY STONE SURGERY  2009   LUMBAR LAMINECTOMY/DECOMPRESSION MICRODISCECTOMY Left 08/03/2016   Procedure: Microlumbar decompression L5-S1 left;  Surgeon: Reyes Billing, MD;  Location: [facility];  Service: Orthopedics;  Laterality: Left;   TUBAL LIGATION     WISDOM TOOTH EXTRACTION     Patient Active Problem List   Diagnosis Date Noted   HNP (herniated nucleus pulposus), lumbar 08/03/2016   Smoker 08/14/2013   Vitamin D  deficiency 08/14/2013   Subclinical hypothyroidism 08/14/2013   Pineal gland cyst    Hyperlipidemia    Thyroid disease    Menses, irregular    History of kidney stones    Elevated LFTs    Hx of migraines     PCP: Leavy Mole, PA-C  REFERRING PROVIDER: Waddell Cain PA-C  REFERRING DIAG: right adhesive capsulitis  THERAPY DIAG:  Right shoulder pain, right shoulder stiffness, weakness  Rationale for Evaluation and Treatment: Rehabilitation  ONSET DATE: 6 months  SUBJECTIVE:                                                                                                                                                                                       SUBJECTIVE STATEMENT: The injection did not help the left like it did the right. It took the edge off but it is still painful. Pain is 4/10.    Patient feels  her left arm is freezing up. She is having a hard time getting it behind her head. She is scheduled for an injection tomorrow. Noted improved ROM since starting therapy. Pain 7/10   From Eval: Right shoulder started hurting 6 months ago for no reason  Doctor gave table ex's (flexion, abduction), doorway and turn body wall stretch Hand dominance: Right  PERTINENT HISTORY: Menopause at age 76; non diabetic Back surgery 2017  PAIN:   Are you having pain? Yes NPRS scale: 7-8/10 Pain location: left shoulder Pain orientation: Right  PAIN TYPE: throbbing Pain description: constant  Aggravating factors: pain at night arm behind back, hard to comb hair and hook my bra; wiping myself after I go to the bathroom Relieving factors: heat, ice, aspirin  ; sitting   PRECAUTIONS: None    WEIGHT BEARING RESTRICTIONS: No  FALLS:  Has patient fallen in last 6 months? No   OCCUPATION: Light duty-computer work uses a mouse/keyboard; usually take boxes and trash out but someone else helps with that now  PLOF: Independent  PATIENT GOALS:be able to do my hair, wipe myself, put bra on, get dressed without pain   NEXT MD VISIT:   OBJECTIVE:  Note: Objective measures were completed at Evaluation unless otherwise noted.  DIAGNOSTIC FINDINGS:  MRI IMAGING: IMPRESSION: 1. Tendinosis of the rotator cuff without focal tear. 2. Thickening and increased signal intensity of the inferior capsule the axillary pouch with small joint effusion and proliferative tissue of the joint. This is nonspecific and may be related to previous injury of the inferior glenohumeral ligament  or adhesive capsulitis. 3. Trace apparent reactive fluid of the subacromial subdeltoid bursa. 4. Degenerative labral changes with probable type I SLAP.   PATIENT SURVEYS:  Quick Dash 75% severe difficulty with ADLs 10/30/2023 Quick: 61.4/100 61.4%  COGNITION: Overall cognitive status: Within functional limits for tasks assessed      UPPER EXTREMITY ROM:  cervical ROM WFLs except mild limitation with extension and sidebending (not painful); painful with scapular retraction  Active ROM Right Eval (supine) 09/26/2023 Standing Left eval Right  supine Right 5/30 6/4 7/1  Shoulder flexion 95 95 158 106 supine 108 standing 103 seated R   106 seated; 111 supine R;  L 126 supine, 120 seated  Shoulder extension         Shoulder abduction 76 65 152 Sidelying 90 degrees   99 R 97   Shoulder adduction         Shoulder internal rotation Right buttock   Radial styloid T8   Right buttock R buttock  Shoulder external rotation Ulnar styloid to ear 35  Ulnar styloid to C4;45   Ulnar styloid to ear  40 degrees R middle finger to C7  Elbow flexion WFLs  WFLs      Elbow extension 0  0      Wrist flexion         Wrist extension         Wrist ulnar deviation         Wrist radial deviation         Wrist pronation         Wrist supination         (Blank rows = not tested)  UPPER EXTREMITY MMT:  MMT Right eval Left eval Right 6/4  Shoulder flexion 3- 5   Shoulder extension 4 5   Shoulder abduction 3- 5   Shoulder adduction     Shoulder internal rotation 3 5   Shoulder  external rotation 3 5   Middle trapezius 3 5   Lower trapezius 3 5   Elbow flexion 4 5   Elbow extension 4- 5   Wrist flexion     Wrist extension     Wrist ulnar deviation     Wrist radial deviation     Wrist pronation     Wrist supination     Grip strength (lbs) 35# 50# 44#  (Blank rows = not tested)  JOINT MOBILITY TESTING:  Glenohumeral and scapular hypomobility; pain relief with glenohumeral  distraction  PALPATION:  Tender points in upper traps                                                                                                                             TREATMENT DATE:  11/27/2023 Single arm ball roll ups with red stability ball 10x bilaterally Supine shoulder Alphabet with 1# using bolster under knees Supine Serratus punch 1# 2 x 8 bilateral  Supine shoulder flexion with dowel + 2.5# AW x 10 Seated shoulder flexion & scaption 2# 2 x 10 bilateral  Landmine press with dowel + pink long band x 10 (unable to do on left increased pain) Holding counter stepping backward for shoulder stretch 10x 5 sec hold Standing shoulder row & extension with green TB 2 x 10 4D shoulder stabilization with small blue plyo x 20 each direction     11/16/2023 Single arm ball roll ups with red stability ball 10x bilaterally Finger ladder 4x bilaterally  Holding counter stepping backward for shoulder stretch 10x 5 sec hold UE Ranger on wall forward flexion and diagonal 8x right/left  Standing shoulder row and extension with red TB 2x10 ISO walkouts using red TB for shoulder external rotation and internal rotation 3 steps x6 bilateral  Supine shoulder Alphabet with 1# using bolster under knees Supine shoulder flexion with dowel + 2.5# AW x 10    11/07/2023 Moist heat concurrent with manual therapy Manual therapy: glenohumeral joint mobs: grade 3 inferior, posterior and distractions, mobs with movement Holding counter stepping backward for shoulder stretch 10x 5 sec hold Hand on doorway turning head for pectoral stretch 5x 10sec hold bilateral  UE Ranger on mat table forward flexion 10x right/left  Pendulums 4# 15x right/left Encouraged frequent ROM (has pulleys at home) especially in supine every 2 hours KT left shoulder: (right shoulder skin irritation so no tape today) 2 vertical strips anterior and posterior deltoids and 1 horizontal strip across distal deltoids instruction in  2-3 day wear time   10/30/2023 Quick Dash: 61.4/100 61.4% Thoracic extension seated with towel roll 10x Table supported shoulder flexion & scaption towel slides x 10 bilateral  Pendulum 4# swings x 10 each direction bilateral  Holding counter stepping backward for shoulder stretch 10x 5 sec hold Hand on wall turning body for pectoral stretch 5x 10sec hold bilateral  Shoulder ER/IR walkouts with yellow TB x 5 reps (3 steps) bilateral  KT to  both shoulders: 2 vertical strips anterior and posterior deltoids and 1 horizontal strip across distal deltoids instruction in 2-3 day wear time   PATIENT EDUCATION: Education details: Educated patient on anatomy and physiology of current symptoms, prognosis, plan of care as well as initial self care strategies to promote recovery Person educated: Patient Education method: Explanation Education comprehension: verbalized understanding  HOME EXERCISE PROGRAM: Access Code: [email or web address] URL: [email or web address] Date: 11/27/2023 Prepared by: Kristeen Sar  Exercises - Supine Shoulder Flexion PROM (Mirrored)  - 1 x daily - 7 x weekly - 1 sets - 3-5 reps - 30 hold - Standing Shoulder Inferior Glide with Towel Roll (Mirrored)  - 1 x daily - 7 x weekly - 1 sets - 10 reps - Circular Shoulder Pendulum with Table Support  - 1 x daily - 7 x weekly - 1 sets - 10 reps - Standing Shoulder Flexion to 90 Degrees with Dumbbells  - 1 x daily - 7 x weekly - 2 sets - 10 reps - Scaption with Dumbbells  - 1 x daily - 7 x weekly - 2 sets - 10 reps - Standing Shoulder Row with Anchored Resistance  - 1 x daily - 7 x weekly - 2 sets - 10 reps - Shoulder extension with resistance - Neutral  - 1 x daily - 7 x weekly - 2 sets - 10 reps  ASSESSMENT:  CLINICAL IMPRESSION: Triva presents verbalizing moderated shoulder pain levels. She feels the shoulder injection to her left shoulder did not provided her with the same relief as it did her right shoulder. Left  shoulder ROM continues to be better than right ROM. Introduced more periscapular strengthening today and patient tolerated exercises well. Updated HEP to include these exercises. She verbalized feeling her right arm is weaker than the left. PT monitored patient throughout and provided verbal and visual cues as needed. Patient should continue to progress well with therapy.     OBJECTIVE IMPAIRMENTS: decreased activity tolerance, decreased ROM, decreased strength, hypomobility, increased fascial restrictions, impaired perceived functional ability, impaired UE functional use, and pain.   ACTIVITY LIMITATIONS: carrying, lifting, sleeping, toileting, dressing, reach over head, and hygiene/grooming  PARTICIPATION LIMITATIONS: meal prep, cleaning, laundry, and occupation  PERSONAL FACTORS: Time since onset of injury/illness/exacerbation are also affecting patient's functional outcome.   REHAB POTENTIAL: Good  CLINICAL DECISION MAKING: Stable/uncomplicated  EVALUATION COMPLEXITY: Low   GOALS: Goals reviewed with patient? Yes  SHORT TERM GOALS: Target date: 10/05/2023   The patient will demonstrate knowledge of basic self care strategies and exercises to promote healing  Baseline: Goal status: met 5/30 2.  Improved right shoulder extension and internal rotation ROM needed for toileting/wiping Baseline:  Goal status: ongoing  3.  Improved right shoulder flexion to 108 degrees needed for combing her hair Baseline:  Goal status: met 5/30  4.  Improved grip strength to 42# needed for carrying bags or boxes Baseline:  Goal status: met 6/4    LONG TERM GOALS: Target date:12/25/2023  The patient will be independent in a safe self progression of a home exercise program to promote further recovery of function   Baseline:  Goal status: In progress 10/30/2023  2.  The patient will have improved shoulder elevation ROM to at least 120 degrees needed for grooming/dressing purposes as well as  reaching high shelves  Baseline:  Goal status: INITIAL  3.  The patient will have grossly 4-/5 strength needed to lift and lower a 2# object from a high shelf  Baseline:  Goal status: In progress 10/30/2023  4.  The patient will report a 60% improvement in pain levels with functional activities which are currently difficult including sleeping, grooming and dressing tasks Baseline:  Goal status: In progress 10/30/2023  5.  Quick DASH score improved to 62% indicating improved function with less pain Baseline: 61.4% Goal status: In progress 10/30/2023   PLAN:  PT FREQUENCY: 1-2x/week  PT DURATION: 8 weeks  PLANNED INTERVENTIONS: 97164- PT Re-evaluation, 97110-Therapeutic exercises, 97530- Therapeutic activity, 97112- Neuromuscular re-education, 97535- Self Care, [id number]- Manual therapy, [phone]- Aquatic Therapy, [phone]- Electrical stimulation (unattended), [phone]- Electrical stimulation (manual), [phone]- Ionotophoresis 4mg /ml Dexamethasone , Patient/Family education, Taping, Dry Needling, Joint mobilization, Spinal mobilization, Cryotherapy, and Moist heat  PLAN FOR NEXT SESSION: assess updated HEP & response to treatment session; continue ROM and strength of both shoulders;  glenohumeral and scapular joint mobilizations  Kristeen Sar, PT 11/27/23 10:15 AM

## 2023-12-06 ENCOUNTER — Encounter: Payer: Self-pay | Admitting: Physical Therapy

## 2023-12-06 ENCOUNTER — Ambulatory Visit: Admitting: Physical Therapy

## 2023-12-06 DIAGNOSIS — M6281 Muscle weakness (generalized): Secondary | ICD-10-CM

## 2023-12-06 DIAGNOSIS — M25611 Stiffness of right shoulder, not elsewhere classified: Secondary | ICD-10-CM

## 2023-12-06 DIAGNOSIS — M25512 Pain in left shoulder: Secondary | ICD-10-CM

## 2023-12-06 DIAGNOSIS — M25511 Pain in right shoulder: Secondary | ICD-10-CM | POA: Diagnosis not present

## 2023-12-06 DIAGNOSIS — G8929 Other chronic pain: Secondary | ICD-10-CM

## 2023-12-06 NOTE — Therapy (Signed)
 OUTPATIENT PHYSICAL THERAPY SHOULDER TREATMENT   Patient Name: [name] MRN: [id number] DOB:May 03, 1971, 53 y.o., female Today's Date: 12/06/2023  END OF SESSION:  PT End of Session - 12/06/23 1317     Visit Number 15    Date for PT Re-Evaluation 12/25/23    Authorization Type [employer] no auth req    PT Start Time 1233    PT Stop Time 1312    PT Time Calculation (min) 39 min    Activity Tolerance Patient tolerated treatment well    Behavior During Therapy WFL for tasks assessed/performed                  Past Medical History:  Diagnosis Date   Anxiety    Complication of anesthesia    slow to wake up   Elevated LFTs    Family history of adverse reaction to anesthesia    mom slow to wake up   Headache    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   History of kidney stones    Hx of migraines    Hyperlipidemia    hx of   Menses, irregular    Pineal gland cyst    Thyroid disease    hx abnormal thyroid levels   Past Surgical History:  Procedure Laterality Date   KIDNEY STONE SURGERY  2009   LUMBAR LAMINECTOMY/DECOMPRESSION MICRODISCECTOMY Left 08/03/2016   Procedure: Microlumbar decompression L5-S1 left;  Surgeon: Reyes Billing, MD;  Location: [facility];  Service: Orthopedics;  Laterality: Left;   TUBAL LIGATION     WISDOM TOOTH EXTRACTION     Patient Active Problem List   Diagnosis Date Noted   HNP (herniated nucleus pulposus), lumbar 08/03/2016   Smoker 08/14/2013   Vitamin D  deficiency 08/14/2013   Subclinical hypothyroidism 08/14/2013   Pineal gland cyst    Hyperlipidemia    Thyroid disease    Menses, irregular    History of kidney stones    Elevated LFTs    Hx of migraines     PCP: Leavy Mole, PA-C  REFERRING PROVIDER: Waddell Cain PA-C  REFERRING DIAG: right adhesive capsulitis  THERAPY DIAG:  Right shoulder pain, right shoulder stiffness, weakness  Rationale for Evaluation and Treatment: Rehabilitation  ONSET DATE: 6 months  SUBJECTIVE:                                                                                                                                                                                       SUBJECTIVE STATEMENT: Patient reports she is doing okay today. Her pain is getting better since the injections. She is noting improvements.    Patient feels her left arm is freezing  up. She is having a hard time getting it behind her head. She is scheduled for an injection tomorrow. Noted improved ROM since starting therapy. Pain 7/10   From Eval: Right shoulder started hurting 6 months ago for no reason  Doctor gave table ex's (flexion, abduction), doorway and turn body wall stretch Hand dominance: Right  PERTINENT HISTORY: Menopause at age 69; non diabetic Back surgery 2017  PAIN:   Are you having pain? Yes NPRS scale: 7-8/10 Pain location: left shoulder Pain orientation: Right  PAIN TYPE: throbbing Pain description: constant  Aggravating factors: pain at night arm behind back, hard to comb hair and hook my bra; wiping myself after I go to the bathroom Relieving factors: heat, ice, aspirin  ; sitting   PRECAUTIONS: None    WEIGHT BEARING RESTRICTIONS: No  FALLS:  Has patient fallen in last 6 months? No   OCCUPATION: Light duty-computer work uses a mouse/keyboard; usually take boxes and trash out but someone else helps with that now  PLOF: Independent  PATIENT GOALS:be able to do my hair, wipe myself, put bra on, get dressed without pain   NEXT MD VISIT:   OBJECTIVE:  Note: Objective measures were completed at Evaluation unless otherwise noted.  DIAGNOSTIC FINDINGS:  MRI IMAGING: IMPRESSION: 1. Tendinosis of the rotator cuff without focal tear. 2. Thickening and increased signal intensity of the inferior capsule the axillary pouch with small joint effusion and proliferative tissue of the joint. This is nonspecific and may be related to previous injury of the inferior glenohumeral  ligament or adhesive capsulitis. 3. Trace apparent reactive fluid of the subacromial subdeltoid bursa. 4. Degenerative labral changes with probable type I SLAP.   PATIENT SURVEYS:  Quick Dash 75% severe difficulty with ADLs 10/30/2023 Quick: 61.4/100 61.4%  COGNITION: Overall cognitive status: Within functional limits for tasks assessed      UPPER EXTREMITY ROM:  cervical ROM WFLs except mild limitation with extension and sidebending (not painful); painful with scapular retraction  Active ROM Right Eval (supine) 09/26/2023 Standing Left eval Right  supine Right 5/30 6/4 7/1  Shoulder flexion 95 95 158 106 supine 108 standing 103 seated R   106 seated; 111 supine R;  L 126 supine, 120 seated  Shoulder extension         Shoulder abduction 76 65 152 Sidelying 90 degrees   99 R 97   Shoulder adduction         Shoulder internal rotation Right buttock   Radial styloid T8   Right buttock R buttock  Shoulder external rotation Ulnar styloid to ear 35  Ulnar styloid to C4;45   Ulnar styloid to ear  40 degrees R middle finger to C7  Elbow flexion WFLs  WFLs      Elbow extension 0  0      Wrist flexion         Wrist extension         Wrist ulnar deviation         Wrist radial deviation         Wrist pronation         Wrist supination         (Blank rows = not tested)  UPPER EXTREMITY MMT:  MMT Right eval Left eval Right 6/4  Shoulder flexion 3- 5   Shoulder extension 4 5   Shoulder abduction 3- 5   Shoulder adduction     Shoulder internal rotation 3 5   Shoulder external rotation 3 5  Middle trapezius 3 5   Lower trapezius 3 5   Elbow flexion 4 5   Elbow extension 4- 5   Wrist flexion     Wrist extension     Wrist ulnar deviation     Wrist radial deviation     Wrist pronation     Wrist supination     Grip strength (lbs) 35# 50# 44#  (Blank rows = not tested)  JOINT MOBILITY TESTING:  Glenohumeral and scapular hypomobility; pain relief with glenohumeral  distraction  PALPATION:  Tender points in upper traps                                                                                                                             TREATMENT DATE:  12/06/2023 Seated shoulder flexion & scaption using blue stability ball x 10 each direction Standing shoulder row & extension with green TB 2 x 10 Seated shoulder abduction with green TB 2 x 10 Supine shoulder flexion with dowel + 2.5# AW 2 x 12 Seated shoulder flexion & scaption 2# 2 x 10 bilateral  Supine Serratus punch 2# 2 x 10 bilateral  4D shoulder stabilization with small blue plyo x 20 each direction 3 way scap stabilization with blue loop x 8 each bilateral  Pushups at wall x 8 Plank at wall + shoulder raise x 20 total     11/27/2023 Single arm ball roll ups with red stability ball 10x bilaterally Supine shoulder Alphabet with 1# using bolster under knees Supine Serratus punch 1# 2 x 8 bilateral  Supine shoulder flexion with dowel + 2.5# AW x 10 Seated shoulder flexion & scaption 2# 2 x 10 bilateral  Landmine press with dowel + pink long band x 10 (unable to do on left increased pain) Holding counter stepping backward for shoulder stretch 10x 5 sec hold Standing shoulder row & extension with green TB 2 x 10 4D shoulder stabilization with small blue plyo x 20 each direction     11/16/2023 Single arm ball roll ups with red stability ball 10x bilaterally Finger ladder 4x bilaterally  Holding counter stepping backward for shoulder stretch 10x 5 sec hold UE Ranger on wall forward flexion and diagonal 8x right/left  Standing shoulder row and extension with red TB 2x10 ISO walkouts using red TB for shoulder external rotation and internal rotation 3 steps x6 bilateral  Supine shoulder Alphabet with 1# using bolster under knees Supine shoulder flexion with dowel + 2.5# AW x 10    11/07/2023 Moist heat concurrent with manual therapy Manual therapy: glenohumeral joint mobs: grade 3  inferior, posterior and distractions, mobs with movement Holding counter stepping backward for shoulder stretch 10x 5 sec hold Hand on doorway turning head for pectoral stretch 5x 10sec hold bilateral  UE Ranger on mat table forward flexion 10x right/left  Pendulums 4# 15x right/left Encouraged frequent ROM (has pulleys at home) especially in supine every 2 hours KT left shoulder: (right shoulder  skin irritation so no tape today) 2 vertical strips anterior and posterior deltoids and 1 horizontal strip across distal deltoids instruction in 2-3 day wear time  PATIENT EDUCATION: Education details: Educated patient on anatomy and physiology of current symptoms, prognosis, plan of care as well as initial self care strategies to promote recovery Person educated: Patient Education method: Explanation Education comprehension: verbalized understanding  HOME EXERCISE PROGRAM: Access Code: [email or web address] URL: [email or web address] Date: 12/06/2023 Prepared by: Kristeen Sar  Exercises - Supine Shoulder Flexion PROM (Mirrored)  - 1 x daily - 7 x weekly - 1 sets - 3-5 reps - 30 hold - Standing Shoulder Inferior Glide with Towel Roll (Mirrored)  - 1 x daily - 7 x weekly - 1 sets - 10 reps - Circular Shoulder Pendulum with Table Support  - 1 x daily - 7 x weekly - 1 sets - 10 reps - Standing Shoulder Flexion to 90 Degrees with Dumbbells  - 1 x daily - 7 x weekly - 2 sets - 10 reps - Scaption with Dumbbells  - 1 x daily - 7 x weekly - 2 sets - 10 reps - Standing Shoulder Row with Anchored Resistance  - 1 x daily - 7 x weekly - 2 sets - 10 reps - Shoulder extension with resistance - Neutral  - 1 x daily - 7 x weekly - 2 sets - 10 reps - Wall Push Up  - 1 x daily - 7 x weekly - 1 sets - 8 reps - Scapular Stability on Wall With Resistance Band (Hemiplegia)   - 1 x daily - 7 x weekly - 1 sets - 10 reps  ASSESSMENT:  CLINICAL IMPRESSION: [name] verbalized feeling improvements in her shoulder ROM  and pain since getting the injection. She feels it took longer for her to feel the benefit after the left shoulder injection but she is noticing the relief now. Progressed periscapular strengthening exercises today and patient tolerated exercises well. Noted muscle fatigue with 3 way scap stabilization exercise. PT monitored patient throughout and provided verbal and visual cues as needed. Updated HEP to include exercise progressions. Patient is highly compliant and motivated to improve shoulder function.     OBJECTIVE IMPAIRMENTS: decreased activity tolerance, decreased ROM, decreased strength, hypomobility, increased fascial restrictions, impaired perceived functional ability, impaired UE functional use, and pain.   ACTIVITY LIMITATIONS: carrying, lifting, sleeping, toileting, dressing, reach over head, and hygiene/grooming  PARTICIPATION LIMITATIONS: meal prep, cleaning, laundry, and occupation  PERSONAL FACTORS: Time since onset of injury/illness/exacerbation are also affecting patient's functional outcome.   REHAB POTENTIAL: Good  CLINICAL DECISION MAKING: Stable/uncomplicated  EVALUATION COMPLEXITY: Low   GOALS: Goals reviewed with patient? Yes  SHORT TERM GOALS: Target date: 10/05/2023   The patient will demonstrate knowledge of basic self care strategies and exercises to promote healing  Baseline: Goal status: met 5/30 2.  Improved right shoulder extension and internal rotation ROM needed for toileting/wiping Baseline:  Goal status: ongoing  3.  Improved right shoulder flexion to 108 degrees needed for combing her hair Baseline:  Goal status: met 5/30  4.  Improved grip strength to 42# needed for carrying bags or boxes Baseline:  Goal status: met 6/4    LONG TERM GOALS: Target date:12/25/2023  The patient will be independent in a safe self progression of a home exercise program to promote further recovery of function   Baseline:  Goal status: In progress  10/30/2023  2.  The patient will have improved shoulder elevation ROM to at  least 120 degrees needed for grooming/dressing purposes as well as reaching high shelves  Baseline:  Goal status: INITIAL  3.  The patient will have grossly 4-/5 strength needed to lift and lower a 2# object from a high shelf  Baseline:  Goal status: In progress 10/30/2023  4.  The patient will report a 60% improvement in pain levels with functional activities which are currently difficult including sleeping, grooming and dressing tasks Baseline:  Goal status: In progress 10/30/2023  5.  Quick DASH score improved to 62% indicating improved function with less pain Baseline: 61.4% Goal status: In progress 10/30/2023   PLAN:  PT FREQUENCY: 1-2x/week  PT DURATION: 8 weeks  PLANNED INTERVENTIONS: 97164- PT Re-evaluation, 97110-Therapeutic exercises, 97530- Therapeutic activity, 97112- Neuromuscular re-education, 97535- Self Care, [id number]- Manual therapy, [phone]- Aquatic Therapy, [phone]- Electrical stimulation (unattended), [phone]- Electrical stimulation (manual), [phone]- Ionotophoresis 4mg /ml Dexamethasone , Patient/Family education, Taping, Dry Needling, Joint mobilization, Spinal mobilization, Cryotherapy, and Moist heat  PLAN FOR NEXT SESSION: continue ROM and strength of both shoulders;  glenohumeral and scapular joint mobilizations  Kristeen Sar, PT 12/06/23 1:18 PM

## 2023-12-11 ENCOUNTER — Encounter: Payer: Self-pay | Admitting: Physical Therapy

## 2023-12-11 ENCOUNTER — Ambulatory Visit: Attending: Physician Assistant | Admitting: Physical Therapy

## 2023-12-11 DIAGNOSIS — M25611 Stiffness of right shoulder, not elsewhere classified: Secondary | ICD-10-CM | POA: Insufficient documentation

## 2023-12-11 DIAGNOSIS — M6281 Muscle weakness (generalized): Secondary | ICD-10-CM | POA: Insufficient documentation

## 2023-12-11 DIAGNOSIS — M25512 Pain in left shoulder: Secondary | ICD-10-CM | POA: Insufficient documentation

## 2023-12-11 DIAGNOSIS — M25511 Pain in right shoulder: Secondary | ICD-10-CM | POA: Insufficient documentation

## 2023-12-11 DIAGNOSIS — G8929 Other chronic pain: Secondary | ICD-10-CM | POA: Insufficient documentation

## 2023-12-11 NOTE — Therapy (Signed)
 OUTPATIENT PHYSICAL THERAPY SHOULDER TREATMENT   Patient Name: [name] MRN: [id number] DOB:February 13, 1971, 53 y.o., female Today's Date: 12/11/2023  END OF SESSION:  PT End of Session - 12/11/23 1318     Visit Number 16    Date for PT Re-Evaluation 12/25/23    Authorization Type [employer] no auth req    PT Start Time 1232    PT Stop Time 1314    PT Time Calculation (min) 42 min    Activity Tolerance Patient tolerated treatment well    Behavior During Therapy WFL for tasks assessed/performed                   Past Medical History:  Diagnosis Date   Anxiety    Complication of anesthesia    slow to wake up   Elevated LFTs    Family history of adverse reaction to anesthesia    mom slow to wake up   Headache    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   History of kidney stones    Hx of migraines    Hyperlipidemia    hx of   Menses, irregular    Pineal gland cyst    Thyroid disease    hx abnormal thyroid levels   Past Surgical History:  Procedure Laterality Date   KIDNEY STONE SURGERY  2009   LUMBAR LAMINECTOMY/DECOMPRESSION MICRODISCECTOMY Left 08/03/2016   Procedure: Microlumbar decompression L5-S1 left;  Surgeon: Reyes Billing, MD;  Location: [facility];  Service: Orthopedics;  Laterality: Left;   TUBAL LIGATION     WISDOM TOOTH EXTRACTION     Patient Active Problem List   Diagnosis Date Noted   HNP (herniated nucleus pulposus), lumbar 08/03/2016   Smoker 08/14/2013   Vitamin D  deficiency 08/14/2013   Subclinical hypothyroidism 08/14/2013   Pineal gland cyst    Hyperlipidemia    Thyroid disease    Menses, irregular    History of kidney stones    Elevated LFTs    Hx of migraines     PCP: Leavy Mole, PA-C  REFERRING PROVIDER: Waddell Cain PA-C  REFERRING DIAG: right adhesive capsulitis  THERAPY DIAG:  Right shoulder pain, right shoulder stiffness, weakness  Rationale for Evaluation and Treatment: Rehabilitation  ONSET DATE: 6 months  SUBJECTIVE:                                                                                                                                                                                       SUBJECTIVE STATEMENT: Patient reports she is good today. She is not currently having any pain.    Patient feels her left arm is freezing up. She is having a  hard time getting it behind her head. She is scheduled for an injection tomorrow. Noted improved ROM since starting therapy. Pain 7/10   From Eval: Right shoulder started hurting 6 months ago for no reason  Doctor gave table ex's (flexion, abduction), doorway and turn body wall stretch Hand dominance: Right  PERTINENT HISTORY: Menopause at age 49; non diabetic Back surgery 2017  PAIN:   Are you having pain? Yes NPRS scale: 7-8/10 Pain location: left shoulder Pain orientation: Right  PAIN TYPE: throbbing Pain description: constant  Aggravating factors: pain at night arm behind back, hard to comb hair and hook my bra; wiping myself after I go to the bathroom Relieving factors: heat, ice, aspirin  ; sitting   PRECAUTIONS: None    WEIGHT BEARING RESTRICTIONS: No  FALLS:  Has patient fallen in last 6 months? No   OCCUPATION: Light duty-computer work uses a mouse/keyboard; usually take boxes and trash out but someone else helps with that now  PLOF: Independent  PATIENT GOALS:be able to do my hair, wipe myself, put bra on, get dressed without pain   NEXT MD VISIT:   OBJECTIVE:  Note: Objective measures were completed at Evaluation unless otherwise noted.  DIAGNOSTIC FINDINGS:  MRI IMAGING: IMPRESSION: 1. Tendinosis of the rotator cuff without focal tear. 2. Thickening and increased signal intensity of the inferior capsule the axillary pouch with small joint effusion and proliferative tissue of the joint. This is nonspecific and may be related to previous injury of the inferior glenohumeral ligament or adhesive capsulitis. 3. Trace apparent  reactive fluid of the subacromial subdeltoid bursa. 4. Degenerative labral changes with probable type I SLAP.   PATIENT SURVEYS:  Quick Dash 75% severe difficulty with ADLs 10/30/2023 Quick: 61.4/100 61.4%  COGNITION: Overall cognitive status: Within functional limits for tasks assessed      UPPER EXTREMITY ROM:  cervical ROM WFLs except mild limitation with extension and sidebending (not painful); painful with scapular retraction  Active ROM Right Eval (supine) 09/26/2023 Standing Left eval Right  supine Right 5/30 6/4 7/1  Shoulder flexion 95 95 158 106 supine 108 standing 103 seated R   106 seated; 111 supine R;  L 126 supine, 120 seated  Shoulder extension         Shoulder abduction 76 65 152 Sidelying 90 degrees   99 R 97   Shoulder adduction         Shoulder internal rotation Right buttock   Radial styloid T8   Right buttock R buttock  Shoulder external rotation Ulnar styloid to ear 35  Ulnar styloid to C4;45   Ulnar styloid to ear  40 degrees R middle finger to C7  Elbow flexion WFLs  WFLs      Elbow extension 0  0      Wrist flexion         Wrist extension         Wrist ulnar deviation         Wrist radial deviation         Wrist pronation         Wrist supination         (Blank rows = not tested)  UPPER EXTREMITY MMT:  MMT Right eval Left eval Right 6/4  Shoulder flexion 3- 5   Shoulder extension 4 5   Shoulder abduction 3- 5   Shoulder adduction     Shoulder internal rotation 3 5   Shoulder external rotation 3 5   Middle trapezius 3 5  Lower trapezius 3 5   Elbow flexion 4 5   Elbow extension 4- 5   Wrist flexion     Wrist extension     Wrist ulnar deviation     Wrist radial deviation     Wrist pronation     Wrist supination     Grip strength (lbs) 35# 50# 44#  (Blank rows = not tested)  JOINT MOBILITY TESTING:  Glenohumeral and scapular hypomobility; pain relief with glenohumeral distraction  PALPATION:  Tender points in upper traps                                                                                                                              TREATMENT DATE:  12/11/2023 Seated shoulder flexion & scaption using blue stability ball x 10 each direction Seated shoulder flexion & scaption 2# 2 x 10 bilateral  Supine shoulder flexion with dowel + 2.5# AW x 12 Supine clocks (12/6 and 3/9) 2# DB x 8 each bilateral  Supine D2 flexion red TB 2 x 8 bilateral  ISO walkouts using red TB for shoulder external rotation and internal rotation  x4 bilateral  3 way scap stabilization with blue loop x 10 each bilateral  Plank on plinth with BOSU (blue side down) x20 (side to side then up and down) Pushups at wall x 8 Unilateral farmer's carry 10# KB x 1 lap each hand Discussion of POC (plan to discharge at end of POC)     12/06/2023 Seated shoulder flexion & scaption using blue stability ball x 10 each direction Standing shoulder row & extension with green TB 2 x 10 Seated shoulder abduction with green TB 2 x 10 Supine shoulder flexion with dowel + 2.5# AW 2 x 12 Seated shoulder flexion & scaption 2# 2 x 10 bilateral  Supine Serratus punch 2# 2 x 10 bilateral  4D shoulder stabilization with small blue plyo x 20 each direction 3 way scap stabilization with blue loop x 8 each bilateral  Pushups at wall x 8 Plank at wall + shoulder raise x 20 total     11/27/2023 Single arm ball roll ups with red stability ball 10x bilaterally Supine shoulder Alphabet with 1# using bolster under knees Supine Serratus punch 1# 2 x 8 bilateral  Supine shoulder flexion with dowel + 2.5# AW x 10 Seated shoulder flexion & scaption 2# 2 x 10 bilateral  Landmine press with dowel + pink long band x 10 (unable to do on left increased pain) Holding counter stepping backward for shoulder stretch 10x 5 sec hold Standing shoulder row & extension with green TB 2 x 10 4D shoulder stabilization with small blue plyo x 20 each direction    PATIENT  EDUCATION: Education details: Educated patient on anatomy and physiology of current symptoms, prognosis, plan of care as well as initial self care strategies to promote recovery Person educated: Patient Education method: Explanation Education comprehension: verbalized understanding  HOME EXERCISE PROGRAM:  Access Code: [email or web address] URL: [email or web address] Date: 12/06/2023 Prepared by: Kristeen Sar  Exercises - Supine Shoulder Flexion PROM (Mirrored)  - 1 x daily - 7 x weekly - 1 sets - 3-5 reps - 30 hold - Standing Shoulder Inferior Glide with Towel Roll (Mirrored)  - 1 x daily - 7 x weekly - 1 sets - 10 reps - Circular Shoulder Pendulum with Table Support  - 1 x daily - 7 x weekly - 1 sets - 10 reps - Standing Shoulder Flexion to 90 Degrees with Dumbbells  - 1 x daily - 7 x weekly - 2 sets - 10 reps - Scaption with Dumbbells  - 1 x daily - 7 x weekly - 2 sets - 10 reps - Standing Shoulder Row with Anchored Resistance  - 1 x daily - 7 x weekly - 2 sets - 10 reps - Shoulder extension with resistance - Neutral  - 1 x daily - 7 x weekly - 2 sets - 10 reps - Wall Push Up  - 1 x daily - 7 x weekly - 1 sets - 8 reps - Scapular Stability on Wall With Resistance Band (Hemiplegia)   - 1 x daily - 7 x weekly - 1 sets - 10 reps  ASSESSMENT:  CLINICAL IMPRESSION: Kaydie presents to therapy with no increased pain. She verbalized improved shoulder function over the last few weeks. Progressed shoulder stability exercises and patient tolerated them well. She verbalized feeling some muscle fatigue. PT provided verbal and visual cues for correct exercise performance. Discussed progress and POC with patient. Both patient and PT was in agreement that patient is on track to discharge at end of POC.     OBJECTIVE IMPAIRMENTS: decreased activity tolerance, decreased ROM, decreased strength, hypomobility, increased fascial restrictions, impaired perceived functional ability, impaired UE functional  use, and pain.   ACTIVITY LIMITATIONS: carrying, lifting, sleeping, toileting, dressing, reach over head, and hygiene/grooming  PARTICIPATION LIMITATIONS: meal prep, cleaning, laundry, and occupation  PERSONAL FACTORS: Time since onset of injury/illness/exacerbation are also affecting patient's functional outcome.   REHAB POTENTIAL: Good  CLINICAL DECISION MAKING: Stable/uncomplicated  EVALUATION COMPLEXITY: Low   GOALS: Goals reviewed with patient? Yes  SHORT TERM GOALS: Target date: 10/05/2023   The patient will demonstrate knowledge of basic self care strategies and exercises to promote healing  Baseline: Goal status: met 5/30 2.  Improved right shoulder extension and internal rotation ROM needed for toileting/wiping Baseline:  Goal status: ongoing  3.  Improved right shoulder flexion to 108 degrees needed for combing her hair Baseline:  Goal status: met 5/30  4.  Improved grip strength to 42# needed for carrying bags or boxes Baseline:  Goal status: met 6/4    LONG TERM GOALS: Target date:12/25/2023  The patient will be independent in a safe self progression of a home exercise program to promote further recovery of function   Baseline:  Goal status: In progress 10/30/2023  2.  The patient will have improved shoulder elevation ROM to at least 120 degrees needed for grooming/dressing purposes as well as reaching high shelves  Baseline:  Goal status: INITIAL  3.  The patient will have grossly 4-/5 strength needed to lift and lower a 2# object from a high shelf  Baseline:  Goal status: In progress 10/30/2023  4.  The patient will report a 60% improvement in pain levels with functional activities which are currently difficult including sleeping, grooming and dressing tasks Baseline:  Goal status: In progress 10/30/2023  5.  Quick  DASH score improved to 62% indicating improved function with less pain Baseline: 61.4% Goal status: In progress  10/30/2023   PLAN:  PT FREQUENCY: 1-2x/week  PT DURATION: 8 weeks  PLANNED INTERVENTIONS: 97164- PT Re-evaluation, 97110-Therapeutic exercises, 97530- Therapeutic activity, 97112- Neuromuscular re-education, 97535- Self Care, [id number]- Manual therapy, [phone]- Aquatic Therapy, [id number]- Electrical stimulation (unattended), [phone]- Electrical stimulation (manual), [phone]- Ionotophoresis 4mg /ml Dexamethasone , Patient/Family education, Taping, Dry Needling, Joint mobilization, Spinal mobilization, Cryotherapy, and Moist heat  PLAN FOR NEXT SESSION: overhead strengthening; postural strengthening; discharge at end of POC.  Kristeen Sar, PT 12/11/23 1:19 PM

## 2023-12-19 ENCOUNTER — Ambulatory Visit: Admitting: Physical Therapy

## 2023-12-25 ENCOUNTER — Ambulatory Visit: Admitting: Physical Therapy

## 2023-12-25 ENCOUNTER — Encounter: Payer: Self-pay | Admitting: Physical Therapy

## 2023-12-25 DIAGNOSIS — M6281 Muscle weakness (generalized): Secondary | ICD-10-CM

## 2023-12-25 DIAGNOSIS — M25512 Pain in left shoulder: Secondary | ICD-10-CM

## 2023-12-25 DIAGNOSIS — M25611 Stiffness of right shoulder, not elsewhere classified: Secondary | ICD-10-CM

## 2023-12-25 DIAGNOSIS — M25511 Pain in right shoulder: Secondary | ICD-10-CM | POA: Diagnosis not present

## 2023-12-25 DIAGNOSIS — G8929 Other chronic pain: Secondary | ICD-10-CM

## 2023-12-25 NOTE — Therapy (Signed)
 OUTPATIENT PHYSICAL THERAPY SHOULDER TREATMENT/ DISCHARGE NOTE   Patient Name: [name] MRN: [id number] DOB:[birth year], 53 y.o., female Today's Date: 12/25/2023  END OF SESSION:  PT End of Session - 12/25/23 1322     Visit Number 17    Date for PT Re-Evaluation 12/25/23    Authorization Type [employer] no auth req    PT Start Time 1232    PT Stop Time 1316    PT Time Calculation (min) 44 min    Activity Tolerance Patient tolerated treatment well    Behavior During Therapy WFL for tasks assessed/performed                    Past Medical History:  Diagnosis Date   Anxiety    Complication of anesthesia    slow to wake up   Elevated LFTs    Family history of adverse reaction to anesthesia    mom slow to wake up   Headache    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   History of kidney stones    Hx of migraines    Hyperlipidemia    hx of   Menses, irregular    Pineal gland cyst    Thyroid disease    hx abnormal thyroid levels   Past Surgical History:  Procedure Laterality Date   KIDNEY STONE SURGERY  2009   LUMBAR LAMINECTOMY/DECOMPRESSION MICRODISCECTOMY Left 08/03/2016   Procedure: Microlumbar decompression L5-S1 left;  Surgeon: Reyes Billing, MD;  Location: [facility];  Service: Orthopedics;  Laterality: Left;   TUBAL LIGATION     WISDOM TOOTH EXTRACTION     Patient Active Problem List   Diagnosis Date Noted   HNP (herniated nucleus pulposus), lumbar 08/03/2016   Smoker 08/14/2013   Vitamin D  deficiency 08/14/2013   Subclinical hypothyroidism 08/14/2013   Pineal gland cyst    Hyperlipidemia    Thyroid disease    Menses, irregular    History of kidney stones    Elevated LFTs    Hx of migraines     PCP: Leavy Mole, PA-C  REFERRING PROVIDER: Waddell Cain PA-C  REFERRING DIAG: right adhesive capsulitis  THERAPY DIAG:  Right shoulder pain, right shoulder stiffness, weakness  Rationale for Evaluation and Treatment: Rehabilitation  ONSET DATE: 6  months  SUBJECTIVE:                                                                                                                                                                                      SUBJECTIVE STATEMENT: Patient reports 3/10 right shoulder paint today. She is starting to feel the shot not working.    Patient feels her left arm is  freezing up. She is having a hard time getting it behind her head. She is scheduled for an injection tomorrow. Noted improved ROM since starting therapy. Pain 7/10   From Eval: Right shoulder started hurting 6 months ago for no reason  Doctor gave table ex's (flexion, abduction), doorway and turn body wall stretch Hand dominance: Right  PERTINENT HISTORY: Menopause at age 12; non diabetic Back surgery 2017  PAIN:   Are you having pain? Yes NPRS scale: 7-8/10 Pain location: left shoulder Pain orientation: Right  PAIN TYPE: throbbing Pain description: constant  Aggravating factors: pain at night arm behind back, hard to comb hair and hook my bra; wiping myself after I go to the bathroom Relieving factors: heat, ice, aspirin  ; sitting   PRECAUTIONS: None    WEIGHT BEARING RESTRICTIONS: No  FALLS:  Has patient fallen in last 6 months? No   OCCUPATION: Light duty-computer work uses a mouse/keyboard; usually take boxes and trash out but someone else helps with that now  PLOF: Independent  PATIENT GOALS:be able to do my hair, wipe myself, put bra on, get dressed without pain   NEXT MD VISIT:   OBJECTIVE:  Note: Objective measures were completed at Evaluation unless otherwise noted.  DIAGNOSTIC FINDINGS:  MRI IMAGING: IMPRESSION: 1. Tendinosis of the rotator cuff without focal tear. 2. Thickening and increased signal intensity of the inferior capsule the axillary pouch with small joint effusion and proliferative tissue of the joint. This is nonspecific and may be related to previous injury of the inferior glenohumeral  ligament or adhesive capsulitis. 3. Trace apparent reactive fluid of the subacromial subdeltoid bursa. 4. Degenerative labral changes with probable type I SLAP.   PATIENT SURVEYS:  Quick Dash 75% severe difficulty with ADLs 10/30/2023 Quick: 61.4/100 61.4% 12/25/2023 Quick Dash: 20.5/100 20.5%  COGNITION: Overall cognitive status: Within functional limits for tasks assessed      UPPER EXTREMITY ROM:  cervical ROM WFLs except mild limitation with extension and sidebending (not painful); painful with scapular retraction  Active ROM Right Eval (supine) 09/26/2023 Standing Left eval Right  supine Right 5/30 6/4 7/1 8/18  Shoulder flexion 95 95 158 106 supine 108 standing 103 seated R   106 seated; 111 supine R;  L 126 supine, 120 seated Rt:115 Lt :130  Shoulder extension          Shoulder abduction 76 65 152 Sidelying 90 degrees   99 R 97  Rt :95 Lt :120  Shoulder adduction          Shoulder internal rotation Right buttock   Radial styloid T8   Right buttock R buttock Rt :L2 Lt :T7  Shoulder external rotation Ulnar styloid to ear 35  Ulnar styloid to C4;45   Ulnar styloid to ear  40 degrees R middle finger to C7 Rt :Middle finger C 7 Lt :T 3  Elbow flexion WFLs  WFLs       Elbow extension 0  0       Wrist flexion          Wrist extension          Wrist ulnar deviation          Wrist radial deviation          Wrist pronation          Wrist supination          (Blank rows = not tested)  UPPER EXTREMITY MMT:  MMT Right eval Left eval Right 6/4  Shoulder  flexion 3- 5   Shoulder extension 4 5   Shoulder abduction 3- 5   Shoulder adduction     Shoulder internal rotation 3 5   Shoulder external rotation 3 5   Middle trapezius 3 5   Lower trapezius 3 5   Elbow flexion 4 5   Elbow extension 4- 5   Wrist flexion     Wrist extension     Wrist ulnar deviation     Wrist radial deviation     Wrist pronation     Wrist supination     Grip strength (lbs) 35# 50# 44#  (Blank  rows = not tested)  JOINT MOBILITY TESTING:  Glenohumeral and scapular hypomobility; pain relief with glenohumeral distraction  PALPATION:  Tender points in upper traps                                                                                                                             TREATMENT DATE:  12/25/2023 UBE 2/2 level 0- PT present to discuss status Quick Dash: 20.5/100 20.5% ROM Measurements (see above) Goal Assessment Sleeper stretch 4 x 10 sec bilateral  Shoulder internal rotation with dowel behind back x 10 5 sec hold Shoulder IR with pillow case x 8 5sec hold Cabinet reach 2# DB x 8 Standing shoulder abduction with green TB 2 X 10 Standing D2 flexion red TB 2 x 8 bilateral  Plank shoulder tap at counter 2 x 10 3 way scap stabilization with yellow loop x 8 each bilateral  Review of HEP    12/11/2023 Seated shoulder flexion & scaption using blue stability ball x 10 each direction Seated shoulder flexion & scaption 2# 2 x 10 bilateral  Supine shoulder flexion with dowel + 2.5# AW x 12 Supine clocks (12/6 and 3/9) 2# DB x 8 each bilateral  Supine D2 flexion red TB 2 x 8 bilateral  ISO walkouts using red TB for shoulder external rotation and internal rotation  x4 bilateral  3 way scap stabilization with blue loop x 10 each bilateral  Plank on plinth with BOSU (blue side down) x20 (side to side then up and down) Pushups at wall x 8 Unilateral farmer's carry 10# KB x 1 lap each hand Discussion of POC (plan to discharge at end of POC)     12/06/2023 Seated shoulder flexion & scaption using blue stability ball x 10 each direction Standing shoulder row & extension with green TB 2 x 10 Seated shoulder abduction with green TB 2 x 10 Supine shoulder flexion with dowel + 2.5# AW 2 x 12 Seated shoulder flexion & scaption 2# 2 x 10 bilateral  Supine Serratus punch 2# 2 x 10 bilateral  4D shoulder stabilization with small blue plyo x 20 each direction 3 way scap  stabilization with blue loop x 8 each bilateral  Pushups at wall x 8 Plank at wall + shoulder raise x 20 total     11/27/2023 Single  arm ball roll ups with red stability ball 10x bilaterally Supine shoulder Alphabet with 1# using bolster under knees Supine Serratus punch 1# 2 x 8 bilateral  Supine shoulder flexion with dowel + 2.5# AW x 10 Seated shoulder flexion & scaption 2# 2 x 10 bilateral  Landmine press with dowel + pink long band x 10 (unable to do on left increased pain) Holding counter stepping backward for shoulder stretch 10x 5 sec hold Standing shoulder row & extension with green TB 2 x 10 4D shoulder stabilization with small blue plyo x 20 each direction    PATIENT EDUCATION: Education details: Educated patient on anatomy and physiology of current symptoms, prognosis, plan of care as well as initial self care strategies to promote recovery Person educated: Patient Education method: Explanation Education comprehension: verbalized understanding  HOME EXERCISE PROGRAM: Access Code: [email or web address] URL: [email or web address] Date: 12/25/2023 Prepared by: Kristeen Sar  Exercises - Supine Shoulder Flexion PROM (Mirrored)  - 1 x daily - 7 x weekly - 1 sets - 3-5 reps - 30 hold - Standing Shoulder Inferior Glide with Towel Roll (Mirrored)  - 1 x daily - 7 x weekly - 1 sets - 10 reps - Circular Shoulder Pendulum with Table Support  - 1 x daily - 7 x weekly - 1 sets - 10 reps - Standing Shoulder Flexion to 90 Degrees with Dumbbells  - 1 x daily - 7 x weekly - 2 sets - 10 reps - Scaption with Dumbbells  - 1 x daily - 7 x weekly - 2 sets - 10 reps - Standing Shoulder Row with Anchored Resistance  - 1 x daily - 7 x weekly - 2 sets - 10 reps - Shoulder extension with resistance - Neutral  - 1 x daily - 7 x weekly - 2 sets - 10 reps - Wall Push Up  - 1 x daily - 7 x weekly - 1 sets - 8 reps - Scapular Stability on Wall With Resistance Band (Hemiplegia)   - 1 x daily - 7 x  weekly - 1 sets - 10 reps - Sleeper Stretch  - 1 x daily - 7 x weekly - 3 sets - 10 hold - Standing Shoulder Internal Rotation AAROM with Dowel  - 1 x daily - 7 x weekly - 1 sets - 8-10 reps - 5 hold - Standing Shoulder Internal Rotation Stretch with Towel  - 1 x daily - 7 x weekly - 1 sets - 8 reps - 5 hold - Supine PNF D2 Flexion with Resistance  - 1 x daily - 7 x weekly - 2 sets - 8 reps  ASSESSMENT:  CLINICAL IMPRESSION: Natividad verbalizes feeling 75% better since starting therapy. She still has difficulty with shoulder IR when performing hygiene and with reaching overhead with her right arm. ROM and QuickDash scores are much improved since the start of car. Updated HEP to include exercise progressions and shoulder IR ROM. PT monitored patient throughout and provided education and ways to improve shoulder function. Goals are partially met at this time and patient is pleased with functional status. Patient to discharge home with HEP.     OBJECTIVE IMPAIRMENTS: decreased activity tolerance, decreased ROM, decreased strength, hypomobility, increased fascial restrictions, impaired perceived functional ability, impaired UE functional use, and pain.   ACTIVITY LIMITATIONS: carrying, lifting, sleeping, toileting, dressing, reach over head, and hygiene/grooming  PARTICIPATION LIMITATIONS: meal prep, cleaning, laundry, and occupation  PERSONAL FACTORS: Time since onset of injury/illness/exacerbation are also affecting patient's functional outcome.  REHAB POTENTIAL: Good  CLINICAL DECISION MAKING: Stable/uncomplicated  EVALUATION COMPLEXITY: Low   GOALS: Goals reviewed with patient? Yes  SHORT TERM GOALS: Target date: 10/05/2023   The patient will demonstrate knowledge of basic self care strategies and exercises to promote healing  Baseline: Goal status: met 5/30 2.  Improved right shoulder extension and internal rotation ROM needed for toileting/wiping Baseline:  Goal status:  Partially met (better but still hurts) 12/25/2023  3.  Improved right shoulder flexion to 108 degrees needed for combing her hair Baseline:  Goal status: met 5/30  4.  Improved grip strength to 42# needed for carrying bags or boxes Baseline:  Goal status: met 6/4    LONG TERM GOALS: Target date:12/25/2023  The patient will be independent in a safe self progression of a home exercise program to promote further recovery of function   Baseline:  Goal status: MET 12/25/2023  2.  The patient will have improved shoulder elevation ROM to at least 120 degrees needed for grooming/dressing purposes as well as reaching high shelves  Baseline:  Goal status: Partially met (115 Rt ) 12/25/2023  3.  The patient will have grossly 4-/5 strength needed to lift and lower a 2# object from a high shelf  Baseline:  Goal status: Met (slight pain on right) 12/25/2023  4.  The patient will report a 60% improvement in pain levels with functional activities which are currently difficult including sleeping, grooming and dressing tasks Baseline:  Goal status: MET (75%) 12/25/2023  5.  Quick DASH score improved to 62% indicating improved function with less pain Baseline: 61.4% Goal status: Met 12/25/2023   PLAN:  PT FREQUENCY: 1-2x/week  PT DURATION: 8 weeks  PLANNED INTERVENTIONS: 97164- PT Re-evaluation, 97110-Therapeutic exercises, 97530- Therapeutic activity, 97112- Neuromuscular re-education, 97535- Self Care, [id number]- Manual therapy, [phone]- Aquatic Therapy, [id number]- Electrical stimulation (unattended), [phone]- Electrical stimulation (manual), [phone]- Ionotophoresis 4mg /ml Dexamethasone , Patient/Family education, Taping, Dry Needling, Joint mobilization, Spinal mobilization, Cryotherapy, and Moist heat  PLAN FOR NEXT SESSION: Patient to discharge with HEP  Kristeen Sar, PT 12/25/23 1:23 PM    PHYSICAL THERAPY DISCHARGE SUMMARY  Visits from Start of Care: 17  Current functional level related to goals  / functional outcomes: Goals partially met. Patient pleased with current functional status   Remaining deficits: Limited right ROM and IR; challenge with bathroom hygiene   Education / Equipment: See above   Patient agrees to discharge. Patient goals were partially met. Patient is being discharged due to being pleased with the current functional level.
